# Patient Record
Sex: Male | Born: 1955 | Race: White | Hispanic: No | Marital: Married | Smoking: Never smoker
Health system: Southern US, Community
[De-identification: ages and names within clinical notes are randomized; demographics above are authoritative.]

## PROBLEM LIST (undated history)

## (undated) DIAGNOSIS — B269 Mumps without complication: Secondary | ICD-10-CM

## (undated) DIAGNOSIS — B059 Measles without complication: Secondary | ICD-10-CM

## (undated) DIAGNOSIS — Z8639 Personal history of other endocrine, nutritional and metabolic disease: Secondary | ICD-10-CM

## (undated) DIAGNOSIS — T7840XA Allergy, unspecified, initial encounter: Secondary | ICD-10-CM

## (undated) DIAGNOSIS — B019 Varicella without complication: Secondary | ICD-10-CM

## (undated) DIAGNOSIS — J309 Allergic rhinitis, unspecified: Secondary | ICD-10-CM

## (undated) DIAGNOSIS — J301 Allergic rhinitis due to pollen: Secondary | ICD-10-CM

## (undated) HISTORY — DX: Allergic rhinitis, unspecified: J30.9

## (undated) HISTORY — DX: Measles without complication: B05.9

## (undated) HISTORY — DX: Allergic rhinitis due to pollen: J30.1

## (undated) HISTORY — DX: Mumps without complication: B26.9

## (undated) HISTORY — DX: Varicella without complication: B01.9

## (undated) HISTORY — DX: Personal history of other endocrine, nutritional and metabolic disease: Z86.39

## (undated) HISTORY — DX: Allergy, unspecified, initial encounter: T78.40XA

## (undated) HISTORY — PX: WISDOM TOOTH EXTRACTION: SHX21

---
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
## 2007-02-08 LAB — HM COLONOSCOPY

## 2008-05-08 LAB — HEMOCCULT SLIDES (X 3 CARDS)

## 2009-03-20 LAB — HEMOCCULT SLIDES (X 3 CARDS)

## 2013-02-23 ENCOUNTER — Ambulatory Visit (INDEPENDENT_AMBULATORY_CARE_PROVIDER_SITE_OTHER): Payer: BC Managed Care – PPO | Admitting: Physician Assistant

## 2013-02-23 ENCOUNTER — Encounter: Payer: Self-pay | Admitting: Physician Assistant

## 2013-02-23 VITALS — BP 128/94 | HR 84 | Temp 98.8°F | Resp 16 | Ht 72.5 in | Wt 215.8 lb

## 2013-02-23 DIAGNOSIS — Z0000 Encounter for general adult medical examination without abnormal findings: Secondary | ICD-10-CM | POA: Insufficient documentation

## 2013-02-23 DIAGNOSIS — H6980 Other specified disorders of Eustachian tube, unspecified ear: Secondary | ICD-10-CM | POA: Insufficient documentation

## 2013-02-23 DIAGNOSIS — R399 Unspecified symptoms and signs involving the genitourinary system: Secondary | ICD-10-CM | POA: Insufficient documentation

## 2013-02-23 DIAGNOSIS — R3989 Other symptoms and signs involving the genitourinary system: Secondary | ICD-10-CM

## 2013-02-23 DIAGNOSIS — R5381 Other malaise: Secondary | ICD-10-CM | POA: Insufficient documentation

## 2013-02-23 DIAGNOSIS — J309 Allergic rhinitis, unspecified: Secondary | ICD-10-CM | POA: Insufficient documentation

## 2013-02-23 DIAGNOSIS — R42 Dizziness and giddiness: Secondary | ICD-10-CM | POA: Insufficient documentation

## 2013-02-23 LAB — HEPATIC FUNCTION PANEL
ALT: 36 U/L (ref 0–53)
AST: 29 U/L (ref 0–37)
Albumin: 4.8 g/dL (ref 3.5–5.2)

## 2013-02-23 LAB — LIPID PANEL: Cholesterol: 233 mg/dL — ABNORMAL HIGH (ref 0–200)

## 2013-02-23 LAB — BASIC METABOLIC PANEL
BUN: 10 mg/dL (ref 6–23)
CO2: 24 mEq/L (ref 19–32)
Calcium: 9.5 mg/dL (ref 8.4–10.5)
Chloride: 103 mEq/L (ref 96–112)
Creat: 0.74 mg/dL (ref 0.50–1.35)
Glucose, Bld: 92 mg/dL (ref 70–99)

## 2013-02-23 LAB — CBC WITH DIFFERENTIAL/PLATELET
Basophils Absolute: 0 10*3/uL (ref 0.0–0.1)
Eosinophils Relative: 2 % (ref 0–5)
HCT: 44 % (ref 39.0–52.0)
Lymphocytes Relative: 30 % (ref 12–46)
Lymphs Abs: 1.7 10*3/uL (ref 0.7–4.0)
MCHC: 35 g/dL (ref 30.0–36.0)
MCV: 79.6 fL (ref 78.0–100.0)
Monocytes Absolute: 0.5 10*3/uL (ref 0.1–1.0)
RBC: 5.53 MIL/uL (ref 4.22–5.81)
RDW: 13.5 % (ref 11.5–15.5)
WBC: 5.8 10*3/uL (ref 4.0–10.5)

## 2013-02-23 LAB — HEMOGLOBIN A1C: Mean Plasma Glucose: 111 mg/dL (ref <117)

## 2013-02-23 MED ORDER — FLUTICASONE PROPIONATE 50 MCG/ACT NA SUSP: 2.0000 | Freq: Every day | NASAL | Status: DC

## 2013-02-23 NOTE — Progress Notes (Signed)
Patient ID: Steve Schwartz, male   DOB: 08-16-1955, 57 y.o.   MRN: [id number]  Patient presents to clinic today to establish care.  Acute Concerns: Occasional lightheadedness -- patient sometimes feels lightheaded with quick positional changes.  Denies history of anemia.  Episodes may be months apart.  Urinary symptoms -- occasional urgency, hesitancy, and nocturia x 3 (improved with supplement -- prostac[facility]), no abnormal PSA in the past.  Has had worsening symptoms over the past year.  Last PCP told him it was just a part of aging.  Denies dysuria, hematuria, hx of diabetes or kidney stone.  Fatigue -- patient reports increase in fatigue over the past year.  Tries to saty active and eat a healthy diet.  Denies shortness of breath, chest pain, heart palpitations.  Denies history of anemia.  Patient denies hx of low testosterone.  Denies decrease in libido or change to erectile function.  Denies difficulty with concentration or memory.  Chronic Issues: Eustachian Tube Dysfunction -- diagnosed by ENT.  Patient takes occasional sudafed for relief of symtpoms  Allergic Rhinitis -- patient endorses seasonal rhinitis for which he uses chlorpheniramine.  Health Maintenance: Dental -- up-to-date Vision -- up-to-date Colonoscopy -- last colonoscopy 6 years years ago.  Due in 2018.  Immunizations -- up-to-date.  TdaP 2013.  Declines flu shot.  Past Medical History  Diagnosis Date  . Hay fever   . Chicken pox   . Mumps   . Measles     No current outpatient prescriptions on file prior to visit.   No current facility-administered medications on file prior to visit.   No Known Allergies  Family History  Problem Relation Age of Onset  . Hypertension Mother   . Uterine cancer Mother   . Thyroid disease Mother   . Thyroid disease Sister     x2  . Emphysema Father   . Lung disease Father   . Other Father     Blood Disorder  . Stroke Paternal Grandmother   . Heart disease Paternal  Grandfather   . Heart disease Maternal Grandfather   . Obesity Sister   . Healthy Daughter     x2    History   Social History  . Marital Status: Married    Spouse Name: N/A    Number of Children: N/A  . Years of Education: N/A   Social History Main Topics  . Smoking status: Never Smoker   . Smokeless tobacco: Never Used  . Alcohol Use: Yes     Comment: rare  . Drug Use: No  . Sexual Activity: Yes    Birth Control/ Protection: None   Other Topics Concern  . None   Social History Narrative  . None   Review of Systems  Constitutional: Negative for fever, chills, weight loss and malaise/fatigue.  HENT: Negative for ear discharge, ear pain, hearing loss and tinnitus.   Eyes: Negative for blurred vision, double vision and photophobia.  Respiratory: Negative for cough, shortness of breath and wheezing.   Cardiovascular: Negative for chest pain and palpitations.  Gastrointestinal: Negative for heartburn, nausea, vomiting, abdominal pain, diarrhea, constipation, blood in stool and melena.  Genitourinary: Positive for urgency and frequency. Negative for dysuria, hematuria and flank pain.       Nocturia and urinary hesitancy  Neurological: Negative for dizziness, seizures, loss of consciousness and headaches.  Endo/Heme/Allergies: Positive for environmental allergies.  Psychiatric/Behavioral: Negative for depression, suicidal ideas, hallucinations and substance abuse. The patient is not nervous/anxious.    Filed  Vitals:   02/23/13 0951  BP: 128/94  Pulse: 84  Temp: 98.8 F (37.1 C)  Resp: 16    Physical Exam  Vitals reviewed. Constitutional: He is oriented to person, place, and time and well-developed, well-nourished, and in no distress.  HENT:  Head: Normocephalic and atraumatic.  Right Ear: External ear normal.  Left Ear: External ear normal.  Nose: Nose normal.  Mouth/Throat: Oropharynx is clear and moist. No oropharyngeal exudate.  TM within normal limits  bilaterally.  Eyes: Conjunctivae and EOM are normal. Pupils are equal, round, and reactive to light.  Neck: Normal range of motion. Neck supple. No thyromegaly present.  Cardiovascular: Normal rate, regular rhythm, normal heart sounds and intact distal pulses.   Pulmonary/Chest: Effort normal and breath sounds normal. No respiratory distress. He has no wheezes. He has no rales. He exhibits no tenderness.  Abdominal: Soft. Bowel sounds are normal. He exhibits no distension and no mass. There is no tenderness. There is no rebound and no guarding.  Genitourinary: Rectum normal, testes/scrotum normal and penis normal. Prostate is not enlarged and not tender. No discharge found.  Musculoskeletal: Normal range of motion.  Lymphadenopathy:    He has no cervical adenopathy.  Neurological: He is alert and oriented to person, place, and time. No cranial nerve deficit. Gait normal.  Skin: Skin is warm and dry. No rash noted.  Psychiatric: Affect normal.      Assessment/Plan: Allergic rhinitis Rx Flonase.  Eustachian tube dysfunction Rx Flonase to help with symptoms.  Lower urinary tract symptoms (LUTS) Exam WNL.  Will obtain UA and PSA.  Discussed options for symptom relief, pending normal results.  Patient currently on ProstaCare OTC supplement and satisfied with results.  Will think about starting Flomax.  Other malaise and fatigue Will obtain CBC w diff, BMP, TSH and Testosterone levels.  Encourage increase in exercise and other physical activities.  Episodic lightheadedness Will check CBC for anemia.  BP good in clinic.  Symptoms very infrequent and seem mostly related to positional change.  Encourage good fluid intake.  Visit for preventive health examination Will obtain fasting labs.  Patient up-to-date on Health Maintenance.  Declines flu shot today.

## 2013-02-23 NOTE — Assessment & Plan Note (Signed)
Will obtain fasting labs.  Patient up-to-date on Health Maintenance.  Declines flu shot today.

## 2013-02-23 NOTE — Assessment & Plan Note (Signed)
Will check CBC for anemia.  BP good in clinic.  Symptoms very infrequent and seem mostly related to positional change.  Encourage good fluid intake.

## 2013-02-23 NOTE — Assessment & Plan Note (Signed)
Rx: Flonase

## 2013-02-23 NOTE — Assessment & Plan Note (Signed)
Exam WNL.  Will obtain UA and PSA.  Discussed options for symptom relief, pending normal results.  Patient currently on ProstaCare OTC supplement and satisfied with results.  Will think about starting Flomax.

## 2013-02-23 NOTE — Assessment & Plan Note (Signed)
Will obtain CBC w diff, BMP, TSH and Testosterone levels.  Encourage increase in exercise and other physical activities.

## 2013-02-23 NOTE — Patient Instructions (Signed)
Please obtain labs.  I will call you with your results.  Use Flonase daily.  Continue good diet and exercise.  We will schedule follow-up based on lab results.

## 2013-02-23 NOTE — Assessment & Plan Note (Signed)
Rx Flonase to help with symptoms.

## 2013-02-24 LAB — URINALYSIS, ROUTINE W REFLEX MICROSCOPIC
Glucose, UA: NEGATIVE mg/dL
Hgb urine dipstick: NEGATIVE
Ketones, ur: NEGATIVE mg/dL
Leukocytes, UA: NEGATIVE
Specific Gravity, Urine: 1.01 (ref 1.005–1.030)
pH: 7.5 (ref 5.0–8.0)

## 2013-02-24 LAB — TESTOSTERONE: Testosterone: 466 ng/dL (ref 300–890)

## 2013-02-28 ENCOUNTER — Encounter: Payer: Self-pay | Admitting: *Deleted

## 2013-03-10 ENCOUNTER — Telehealth: Payer: Self-pay | Admitting: Physician Assistant

## 2013-03-10 NOTE — Telephone Encounter (Signed)
Received medical records from [facility] at [facility]: [phone] F: [phone]

## 2013-09-09 ENCOUNTER — Encounter: Payer: Self-pay | Admitting: Physician Assistant

## 2014-02-15 ENCOUNTER — Encounter: Payer: Self-pay | Admitting: Physician Assistant

## 2014-02-15 ENCOUNTER — Ambulatory Visit (INDEPENDENT_AMBULATORY_CARE_PROVIDER_SITE_OTHER): Payer: BC Managed Care – PPO | Admitting: Physician Assistant

## 2014-02-15 VITALS — BP 141/95 | HR 82 | Temp 99.4°F | Resp 16 | Ht 72.5 in | Wt 215.2 lb

## 2014-02-15 DIAGNOSIS — N400 Enlarged prostate without lower urinary tract symptoms: Secondary | ICD-10-CM

## 2014-02-15 DIAGNOSIS — G8929 Other chronic pain: Secondary | ICD-10-CM

## 2014-02-15 DIAGNOSIS — Z0000 Encounter for general adult medical examination without abnormal findings: Secondary | ICD-10-CM

## 2014-02-15 DIAGNOSIS — M25569 Pain in unspecified knee: Secondary | ICD-10-CM

## 2014-02-15 DIAGNOSIS — M25562 Pain in left knee: Secondary | ICD-10-CM

## 2014-02-15 DIAGNOSIS — Z136 Encounter for screening for cardiovascular disorders: Secondary | ICD-10-CM

## 2014-02-15 DIAGNOSIS — L821 Other seborrheic keratosis: Secondary | ICD-10-CM

## 2014-02-15 DIAGNOSIS — Z125 Encounter for screening for malignant neoplasm of prostate: Secondary | ICD-10-CM | POA: Insufficient documentation

## 2014-02-15 LAB — LIPID PANEL W/DIRECT LDL/HDL RATIO
CHOL/HDL RATIO: 7.2 ratio
CHOLESTEROL - DIR LDL/HDL RATIO: 216 mg/dL — AB (ref 0–200)
HDL: 30 mg/dL — AB (ref >39)
LDL DIRECT: 174 mg/dL — AB
LDL/HDL RATIO (DIRECT LDL): 5.8 ratio
TRIGLYCERIDES: 100 mg/dL (ref <150)

## 2014-02-15 LAB — BASIC METABOLIC PANEL WITH GFR
BUN: 11 mg/dL (ref 6–23)
CALCIUM: 8.9 mg/dL (ref 8.4–10.5)
CO2: 24 mEq/L (ref 19–32)
Chloride: 105 mEq/L (ref 96–112)
Creat: 0.72 mg/dL (ref 0.50–1.35)
GFR, Est Non African American: >89 mL/min
GLUCOSE: 88 mg/dL (ref 70–99)
POTASSIUM: 3.9 meq/L (ref 3.5–5.3)
SODIUM: 136 meq/L (ref 135–145)

## 2014-02-15 LAB — CBC
HCT: 43.5 % (ref 39.0–52.0)
Hemoglobin: 14.5 g/dL (ref 13.0–17.0)
MCHC: 33.3 g/dL (ref 30.0–36.0)
MCV: 83.1 fl (ref 78.0–100.0)
PLATELETS: 243 10*3/uL (ref 150.0–400.0)
RBC: 5.24 Mil/uL (ref 4.22–5.81)
RDW: 12.8 % (ref 11.5–15.5)
WBC: 5.5 10*3/uL (ref 4.0–10.5)

## 2014-02-15 LAB — URINALYSIS, ROUTINE W REFLEX MICROSCOPIC
BILIRUBIN URINE: NEGATIVE
HGB URINE DIPSTICK: NEGATIVE
KETONES UR: NEGATIVE
Leukocytes, UA: NEGATIVE
Nitrite: NEGATIVE
RBC / HPF: NONE SEEN (ref 0–?)
Specific Gravity, Urine: 1.015 (ref 1.000–1.030)
TOTAL PROTEIN, URINE-UPE24: NEGATIVE
URINE GLUCOSE: NEGATIVE
UROBILINOGEN UA: 0.2 (ref 0.0–1.0)
WBC, UA: NONE SEEN (ref 0–?)
pH: 7.5 (ref 5.0–8.0)

## 2014-02-15 LAB — TSH: TSH: 3.47 u[IU]/mL (ref 0.35–4.50)

## 2014-02-15 LAB — PSA: PSA: 0.77 ng/mL (ref 0.10–4.00)

## 2014-02-15 LAB — HEMOGLOBIN A1C: Hgb A1c MFr Bld: 5.5 % (ref 4.6–6.5)

## 2014-02-15 NOTE — Patient Instructions (Addendum)
Please obtain labs. I will call you with your results.  Start the [employer] supplement following bottle instructions.  If PSA is elevated, we will be sending you to a Urologist.   Preventive Care for Adults A healthy lifestyle and preventive care can promote health and wellness. Preventive health guidelines for men include the following key practices:  A routine yearly physical is a good way to check with your health care provider about your health and preventative screening. It is a chance to share any concerns and updates on your health and to receive a thorough exam.  Visit your dentist for a routine exam and preventative care every 6 months. Brush your teeth twice a day and floss once a day. Good oral hygiene prevents tooth decay and gum disease.  The frequency of eye exams is based on your age, health, family medical history, use of contact lenses, and other factors. Follow your health care provider's recommendations for frequency of eye exams.  Eat a healthy diet. Foods such as vegetables, fruits, whole grains, low-fat dairy products, and lean protein foods contain the nutrients you need without too many calories. Decrease your intake of foods high in solid fats, added sugars, and salt. Eat the right amount of calories for you.Get information about a proper diet from your health care provider, if necessary.  Regular physical exercise is one of the most important things you can do for your health. Most adults should get at least 150 minutes of moderate-intensity exercise (any activity that increases your heart rate and causes you to sweat) each week. In addition, most adults need muscle-strengthening exercises on 2 or more days a week.  Maintain a healthy weight. The body mass index (BMI) is a screening tool to identify possible weight problems. It provides an estimate of body fat based on height and weight. Your health care provider can find your BMI and can help you achieve or maintain a  healthy weight.For adults 20 years and older:  A BMI below 18.5 is considered underweight.  A BMI of 18.5 to 24.9 is normal.  A BMI of 25 to 29.9 is considered overweight.  A BMI of 30 and above is considered obese.  Maintain normal blood lipids and cholesterol levels by exercising and minimizing your intake of saturated fat. Eat a balanced diet with plenty of fruit and vegetables. Blood tests for lipids and cholesterol should begin at age 76 and be repeated every 5 years. If your lipid or cholesterol levels are high, you are over 50, or you are at high risk for heart disease, you may need your cholesterol levels checked more frequently.Ongoing high lipid and cholesterol levels should be treated with medicines if diet and exercise are not working.  If you smoke, find out from your health care provider how to quit. If you do not use tobacco, do not start.  Lung cancer screening is recommended for adults aged 57-80 years who are at high risk for developing lung cancer because of a history of smoking. A yearly low-dose CT scan of the lungs is recommended for people who have at least a 30-pack-year history of smoking and are a current smoker or have quit within the past 15 years. A pack year of smoking is smoking an average of 1 pack of cigarettes a day for 1 year (for example: 1 pack a day for 30 years or 2 packs a day for 15 years). Yearly screening should continue until the smoker has stopped smoking for at least 15  years. Yearly screening should be stopped for people who develop a health problem that would prevent them from having lung cancer treatment.  If you choose to drink alcohol, do not have more than 2 drinks per day. One drink is considered to be 12 ounces (355 mL) of beer, 5 ounces (148 mL) of wine, or 1.5 ounces (44 mL) of liquor.  Avoid use of street drugs. Do not share needles with anyone. Ask for help if you need support or instructions about stopping the use of drugs.  High blood  pressure causes heart disease and increases the risk of stroke. Your blood pressure should be checked at least every 1-2 years. Ongoing high blood pressure should be treated with medicines, if weight loss and exercise are not effective.  If you are 84-59 years old, ask your health care provider if you should take aspirin to prevent heart disease.  Diabetes screening involves taking a blood sample to check your fasting blood sugar level. This should be done once every 3 years, after age 82, if you are within normal weight and without risk factors for diabetes. Testing should be considered at a younger age or be carried out more frequently if you are overweight and have at least 1 risk factor for diabetes.  Colorectal cancer can be detected and often prevented. Most routine colorectal cancer screening begins at the age of 12 and continues through age 39. However, your health care provider may recommend screening at an earlier age if you have risk factors for colon cancer. On a yearly basis, your health care provider may provide home test kits to check for hidden blood in the stool. Use of a small camera at the end of a tube to directly examine the colon (sigmoidoscopy or colonoscopy) can detect the earliest forms of colorectal cancer. Talk to your health care provider about this at age 75, when routine screening begins. Direct exam of the colon should be repeated every 5-10 years through age 35, unless early forms of precancerous polyps or small growths are found.  People who are at an increased risk for hepatitis B should be screened for this virus. You are considered at high risk for hepatitis B if:  You were born in a country where hepatitis B occurs often. Talk with your health care provider about which countries are considered high risk.  Your parents were born in a high-risk country and you have not received a shot to protect against hepatitis B (hepatitis B vaccine).  You have HIV or AIDS.  You  use needles to inject street drugs.  You live with, or have sex with, someone who has hepatitis B.  You are a man who has sex with other men (MSM).  You get hemodialysis treatment.  You take certain medicines for conditions such as cancer, organ transplantation, and autoimmune conditions.  Hepatitis C blood testing is recommended for all people born from [birth year] through 1965 and any individual with known risks for hepatitis C.  Practice safe sex. Use condoms and avoid high-risk sexual practices to reduce the spread of sexually transmitted infections (STIs). STIs include gonorrhea, chlamydia, syphilis, trichomonas, herpes, HPV, and human immunodeficiency virus (HIV). Herpes, HIV, and HPV are viral illnesses that have no cure. They can result in disability, cancer, and death.  If you are at risk of being infected with HIV, it is recommended that you take a prescription medicine daily to prevent HIV infection. This is called preexposure prophylaxis (PrEP). You are considered at risk if:  You are a man who has sex with other men (MSM) and have other risk factors.  You are a heterosexual man, are sexually active, and are at increased risk for HIV infection.  You take drugs by injection.  You are sexually active with a partner who has HIV.  Talk with your health care provider about whether you are at high risk of being infected with HIV. If you choose to begin PrEP, you should first be tested for HIV. You should then be tested every 3 months for as long as you are taking PrEP.  A one-time screening for abdominal aortic aneurysm (AAA) and surgical repair of large AAAs by ultrasound are recommended for men ages 57 to 59 years who are current or former smokers.  Healthy men should no longer receive prostate-specific antigen (PSA) blood tests as part of routine cancer screening. Talk with your health care provider about prostate cancer screening.  Testicular cancer screening is not recommended for  adult males who have no symptoms. Screening includes self-exam, a health care provider exam, and other screening tests. Consult with your health care provider about any symptoms you have or any concerns you have about testicular cancer.  Use sunscreen. Apply sunscreen liberally and repeatedly throughout the day. You should seek shade when your shadow is shorter than you. Protect yourself by wearing long sleeves, pants, a wide-brimmed hat, and sunglasses year round, whenever you are outdoors.  Once a month, do a whole-body skin exam, using a mirror to look at the skin on your back. Tell your health care provider about new moles, moles that have irregular borders, moles that are larger than a pencil eraser, or moles that have changed in shape or color.  Stay current with required vaccines (immunizations).  Influenza vaccine. All adults should be immunized every year.  Tetanus, diphtheria, and acellular pertussis (Td, Tdap) vaccine. An adult who has not previously received Tdap or who does not know his vaccine status should receive 1 dose of Tdap. This initial dose should be followed by tetanus and diphtheria toxoids (Td) booster doses every 10 years. Adults with an unknown or incomplete history of completing a 3-dose immunization series with Td-containing vaccines should begin or complete a primary immunization series including a Tdap dose. Adults should receive a Td booster every 10 years.  Varicella vaccine. An adult without evidence of immunity to varicella should receive 2 doses or a second dose if he has previously received 1 dose.  Human papillomavirus (HPV) vaccine. Males aged 22-21 years who have not received the vaccine previously should receive the 3-dose series. Males aged 22-26 years may be immunized. Immunization is recommended through the age of 20 years for any male who has sex with males and did not get any or all doses earlier. Immunization is recommended for any person with an  immunocompromised condition through the age of 9 years if he did not get any or all doses earlier. During the 3-dose series, the second dose should be obtained 4-8 weeks after the first dose. The third dose should be obtained 24 weeks after the first dose and 16 weeks after the second dose.  Zoster vaccine. One dose is recommended for adults aged 32 years or older unless certain conditions are present.  Measles, mumps, and rubella (MMR) vaccine. Adults born before [birth year] generally are considered immune to measles and mumps. Adults born in [birth year] or later should have 1 or more doses of MMR vaccine unless there is a contraindication to the vaccine or  there is laboratory evidence of immunity to each of the three diseases. A routine second dose of MMR vaccine should be obtained at least 28 days after the first dose for students attending postsecondary schools, health care workers, or international travelers. People who received inactivated measles vaccine or an unknown type of measles vaccine during 1963-1967 should receive 2 doses of MMR vaccine. People who received inactivated mumps vaccine or an unknown type of mumps vaccine before 1979 and are at high risk for mumps infection should consider immunization with 2 doses of MMR vaccine. Unvaccinated health care workers born before [birth year] who lack laboratory evidence of measles, mumps, or rubella immunity or laboratory confirmation of disease should consider measles and mumps immunization with 2 doses of MMR vaccine or rubella immunization with 1 dose of MMR vaccine.  Pneumococcal 13-valent conjugate (PCV13) vaccine. When indicated, a person who is uncertain of his immunization history and has no record of immunization should receive the PCV13 vaccine. An adult aged 43 years or older who has certain medical conditions and has not been previously immunized should receive 1 dose of PCV13 vaccine. This PCV13 should be followed with a dose of pneumococcal polysaccharide  (PPSV23) vaccine. The PPSV23 vaccine dose should be obtained at least 8 weeks after the dose of PCV13 vaccine. An adult aged 66 years or older who has certain medical conditions and previously received 1 or more doses of PPSV23 vaccine should receive 1 dose of PCV13. The PCV13 vaccine dose should be obtained 1 or more years after the last PPSV23 vaccine dose.  Pneumococcal polysaccharide (PPSV23) vaccine. When PCV13 is also indicated, PCV13 should be obtained first. All adults aged 3 years and older should be immunized. An adult younger than age 61 years who has certain medical conditions should be immunized. Any person who resides in a nursing home or long-term care facility should be immunized. An adult smoker should be immunized. People with an immunocompromised condition and certain other conditions should receive both PCV13 and PPSV23 vaccines. People with human immunodeficiency virus (HIV) infection should be immunized as soon as possible after diagnosis. Immunization during chemotherapy or radiation therapy should be avoided. Routine use of PPSV23 vaccine is not recommended for American Indians, [place] Natives, or people younger than 65 years unless there are medical conditions that require PPSV23 vaccine. When indicated, people who have unknown immunization and have no record of immunization should receive PPSV23 vaccine. One-time revaccination 5 years after the first dose of PPSV23 is recommended for people aged 19-64 years who have chronic kidney failure, nephrotic syndrome, asplenia, or immunocompromised conditions. People who received 1-2 doses of PPSV23 before age 87 years should receive another dose of PPSV23 vaccine at age 63 years or later if at least 5 years have passed since the previous dose. Doses of PPSV23 are not needed for people immunized with PPSV23 at or after age 30 years.  Meningococcal vaccine. Adults with asplenia or persistent complement component deficiencies should receive 2  doses of quadrivalent meningococcal conjugate (MenACWY-D) vaccine. The doses should be obtained at least 2 months apart. Microbiologists working with certain meningococcal bacteria, [employer] recruits, people at risk during an outbreak, and people who travel to or live in countries with a high rate of meningitis should be immunized. A first-year college student up through age 36 years who is living in a residence hall should receive a dose if he did not receive a dose on or after his 16th birthday. Adults who have certain high-risk conditions should receive one  or more doses of vaccine.  Hepatitis A vaccine. Adults who wish to be protected from this disease, have certain high-risk conditions, work with hepatitis A-infected animals, work in hepatitis A research labs, or travel to or work in countries with a high rate of hepatitis A should be immunized. Adults who were previously unvaccinated and who anticipate close contact with an international adoptee during the first 60 days after arrival in the [place] from a country with a high rate of hepatitis A should be immunized.  Hepatitis B vaccine. Adults should be immunized if they wish to be protected from this disease, have certain high-risk conditions, may be exposed to blood or other infectious body fluids, are household contacts or sex partners of hepatitis B positive people, are clients or workers in certain care facilities, or travel to or work in countries with a high rate of hepatitis B.  Haemophilus influenzae type b (Hib) vaccine. A previously unvaccinated person with asplenia or sickle cell disease or having a scheduled splenectomy should receive 1 dose of Hib vaccine. Regardless of previous immunization, a recipient of a hematopoietic stem cell transplant should receive a 3-dose series 6-12 months after his successful transplant. Hib vaccine is not recommended for adults with HIV infection. Preventive Service / Frequency Ages 26 to 59  Blood  pressure check.** / Every 1 to 2 years.  Lipid and cholesterol check.** / Every 5 years beginning at age 91.  Hepatitis C blood test.** / For any individual with known risks for hepatitis C.  Skin self-exam. / Monthly.  Influenza vaccine. / Every year.  Tetanus, diphtheria, and acellular pertussis (Tdap, Td) vaccine.** / Consult your health care provider. 1 dose of Td every 10 years.  Varicella vaccine.** / Consult your health care provider.  HPV vaccine. / 3 doses over 6 months, if 32 or younger.  Measles, mumps, rubella (MMR) vaccine.** / You need at least 1 dose of MMR if you were born in 1957 or later. You may also need a second dose.  Pneumococcal 13-valent conjugate (PCV13) vaccine.** / Consult your health care provider.  Pneumococcal polysaccharide (PPSV23) vaccine.** / 1 to 2 doses if you smoke cigarettes or if you have certain conditions.  Meningococcal vaccine.** / 1 dose if you are age 52 to 17 years and a Market researcher living in a residence hall, or have one of several medical conditions. You may also need additional booster doses.  Hepatitis A vaccine.** / Consult your health care provider.  Hepatitis B vaccine.** / Consult your health care provider.  Haemophilus influenzae type b (Hib) vaccine.** / Consult your health care provider. Ages 56 to 29  Blood pressure check.** / Every 1 to 2 years.  Lipid and cholesterol check.** / Every 5 years beginning at age 27.  Lung cancer screening. / Every year if you are aged 77-80 years and have a 30-pack-year history of smoking and currently smoke or have quit within the past 15 years. Yearly screening is stopped once you have quit smoking for at least 15 years or develop a health problem that would prevent you from having lung cancer treatment.  Fecal occult blood test (FOBT) of stool. / Every year beginning at age 37 and continuing until age 87. You may not have to do this test if you get a colonoscopy every 10  years.  Flexible sigmoidoscopy** or colonoscopy.** / Every 5 years for a flexible sigmoidoscopy or every 10 years for a colonoscopy beginning at age 60 and continuing until age  75.  Hepatitis C blood test.** / For all people born from [birth year] through 1965 and any individual with known risks for hepatitis C.  Skin self-exam. / Monthly.  Influenza vaccine. / Every year.  Tetanus, diphtheria, and acellular pertussis (Tdap/Td) vaccine.** / Consult your health care provider. 1 dose of Td every 10 years.  Varicella vaccine.** / Consult your health care provider.  Zoster vaccine.** / 1 dose for adults aged 76 years or older.  Measles, mumps, rubella (MMR) vaccine.** / You need at least 1 dose of MMR if you were born in 1958 or later. You may also need a second dose.  Pneumococcal 13-valent conjugate (PCV13) vaccine.** / Consult your health care provider.  Pneumococcal polysaccharide (PPSV23) vaccine.** / 1 to 2 doses if you smoke cigarettes or if you have certain conditions.  Meningococcal vaccine.** / Consult your health care provider.  Hepatitis A vaccine.** / Consult your health care provider.  Hepatitis B vaccine.** / Consult your health care provider.  Haemophilus influenzae type b (Hib) vaccine.** / Consult your health care provider. Ages 74 and over  Blood pressure check.** / Every 1 to 2 years.  Lipid and cholesterol check.**/ Every 5 years beginning at age 55.  Lung cancer screening. / Every year if you are aged 60-80 years and have a 30-pack-year history of smoking and currently smoke or have quit within the past 15 years. Yearly screening is stopped once you have quit smoking for at least 15 years or develop a health problem that would prevent you from having lung cancer treatment.  Fecal occult blood test (FOBT) of stool. / Every year beginning at age 21 and continuing until age 36. You may not have to do this test if you get a colonoscopy every 10 years.  Flexible  sigmoidoscopy** or colonoscopy.** / Every 5 years for a flexible sigmoidoscopy or every 10 years for a colonoscopy beginning at age 70 and continuing until age 5.  Hepatitis C blood test.** / For all people born from [birth year] through 1966 and any individual with known risks for hepatitis C.  Abdominal aortic aneurysm (AAA) screening.** / A one-time screening for ages 89 to 27 years who are current or former smokers.  Skin self-exam. / Monthly.  Influenza vaccine. / Every year.  Tetanus, diphtheria, and acellular pertussis (Tdap/Td) vaccine.** / 1 dose of Td every 10 years.  Varicella vaccine.** / Consult your health care provider.  Zoster vaccine.** / 1 dose for adults aged 58 years or older.  Pneumococcal 13-valent conjugate (PCV13) vaccine.** / Consult your health care provider.  Pneumococcal polysaccharide (PPSV23) vaccine.** / 1 dose for all adults aged 43 years and older.  Meningococcal vaccine.** / Consult your health care provider.  Hepatitis A vaccine.** / Consult your health care provider.  Hepatitis B vaccine.** / Consult your health care provider.  Haemophilus influenzae type b (Hib) vaccine.** / Consult your health care provider. **Family history and personal history of risk and conditions may change your health care provider's recommendations. Document Released: 06/10/2001 Document Revised: 04/19/2013 Document Reviewed: 09/09/2010 [facility] Patient Information 2015 [place], [place]. This information is not intended to replace advice given to you by your health care provider. Make sure you discuss any questions you have with your health care provider.

## 2014-02-15 NOTE — Assessment & Plan Note (Signed)
Discussed treatment options.  PSA in process. Patient wishes to continue supplements for now.  Encouraged use of [place].  If PSA elevated, will be referred to Urology.

## 2014-02-15 NOTE — Progress Notes (Signed)
Pre visit review using our clinic review tool, if applicable. No additional management support is needed unless otherwise documented below in the visit note/SLS  

## 2014-02-15 NOTE — Assessment & Plan Note (Signed)
EKG reveals NSR. Encouraged 81 mg ASA daily. Will check fasting lipid panel today.

## 2014-02-15 NOTE — Progress Notes (Signed)
Patient presents to clinic today for annual exam.  Patient is fasting for labs.  Patient complains of chronic left knee pain with possible twisting injury in the past.  Pain is only occurring occasionally with walking up stairs.  Endorses occasional buckling.  Denies recent injury. Denies swelling of decreased ROM.  Patient also wishes to have a skin lesion of left forearm looked at.  Has been evaluated by Dermatology previously and states that they did not note anything worrisome.  States lesion is slightly larger at present.  Is non-pruritic, non-painful.  Endorses is skin toned.  Denies excess sun exposure.  Endorses continued LUTS.  Endorses urinary hesitancy. Denies post-void dribbling.  Is on prostate supplement with good relief of symptoms. PSA last year within normal limits. Patient has previously refused Rx medication for BPH.  Health Maintenance: Dental -- up-to-date Vision -- up-to-date Immunizations -- Declines flu shot.  Required immunizations are up-to-date. Colonoscopy -- up-to-date.  Due in 2018  Past Medical History  Diagnosis Date  . Hay fever   . Chicken pox   . Mumps   . Measles   . AR (allergic rhinitis)   . H/O vitamin D deficiency     Past Surgical History  Procedure Laterality Date  . Tonsillectomy and adenoidectomy  1959  . Wisdom tooth extraction      Current Outpatient Prescriptions on File Prior to Visit  Medication Sig Dispense Refill  . chlorpheniramine (CHLOR-TRIMETON) 4 MG tablet Take 4 mg by mouth as needed for allergies.      Marland Kitchen. ibuprofen (ADVIL,MOTRIN) 200 MG tablet Take 200 mg by mouth as needed for pain.      . NON FORMULARY Take by mouth as directed. [employer] Prosta[employer] Supplement.      . pseudoephedrine (SUDAFED) 120 MG 12 hr tablet Take 120 mg by mouth as needed for congestion.       No current facility-administered medications on file prior to visit.    No Known Allergies  Family History  Problem Relation Age of Onset  .  Hypertension Mother   . Uterine cancer Mother   . Thyroid disease Mother   . Thyroid disease Sister     x2  . Emphysema Father   . Lung disease Father   . Other Father     Blood Disorder  . Stroke Paternal Grandmother   . Heart disease Paternal Grandfather   . Heart disease Maternal Grandfather   . Obesity Sister   . Healthy Daughter     x2  . Diabetes Other   . Myelodysplastic syndrome Father     History   Social History  . Marital Status: Married    Spouse Name: N/A    Number of Children: N/A  . Years of Education: N/A   Occupational History  . Not on file.   Social History Main Topics  . Smoking status: Never Smoker   . Smokeless tobacco: Never Used  . Alcohol Use: Yes     Comment: rare  . Drug Use: No  . Sexual Activity: Yes    Birth Control/ Protection: None   Other Topics Concern  . Not on file   Social History Narrative  . No narrative on file   Review of Systems  Constitutional: Negative for fever and weight loss.  HENT: Negative for ear discharge, ear pain, hearing loss and tinnitus.   Eyes: Negative for blurred vision, double vision, photophobia and pain.  Respiratory: Negative for cough and shortness of breath.  Cardiovascular: Negative for chest pain and palpitations.  Gastrointestinal: Negative for heartburn, nausea, vomiting, abdominal pain, diarrhea, constipation, blood in stool and melena.  Genitourinary: Negative for dysuria, urgency, frequency, hematuria and flank pain.  Neurological: Negative for dizziness, tingling, tremors, sensory change, speech change, loss of consciousness and headaches.  Endo/Heme/Allergies: Negative for environmental allergies.  Psychiatric/Behavioral: Negative for depression, suicidal ideas, hallucinations and substance abuse. The patient is not nervous/anxious and does not have insomnia.    BP 141/95  Pulse 82  Temp(Src) 99.4 F (37.4 C) (Oral)  Resp 16  Ht 6' 0.5" (1.842 m)  Wt 215 lb 4 oz (97.637 kg)  BMI  28.78 kg/m2  SpO2 97%  Physical Exam  Vitals reviewed. Constitutional: He is oriented to person, place, and time and well-developed, well-nourished, and in no distress.  HENT:  Head: Normocephalic and atraumatic.  Right Ear: External ear normal.  Left Ear: External ear normal.  Nose: Nose normal.  Mouth/Throat: Oropharynx is clear and moist. No oropharyngeal exudate.  TM within normal limits bilaterally.  Eyes: Conjunctivae and EOM are normal. Pupils are equal, round, and reactive to light.  Neck: Neck supple. No thyromegaly present.  Cardiovascular: Normal rate, regular rhythm, normal heart sounds and intact distal pulses.   Pulmonary/Chest: Effort normal and breath sounds normal. No respiratory distress. He has no wheezes. He has no rales. He exhibits no tenderness.  Abdominal: Soft. Bowel sounds are normal. He exhibits no distension and no mass. There is no tenderness. There is no rebound and no guarding.  Lymphadenopathy:    He has no cervical adenopathy.  Neurological: He is alert and oriented to person, place, and time.  Skin: Skin is warm and dry. No rash noted.  Psychiatric: Affect normal.   Assessment/Plan: Visit for preventive health examination Medical history reviewed and updated. Declines flu shot.  Other health maintenance measures up-to-date.  Will obtain fasting labs at today's visit to include PSA.  Prostate cancer screening Patient with normal previous PSA. Some LUTS. DRE reveals symmetric enlargement of prostate without palpable nodule or mass. Will obtain PSA today.   BPH (benign prostatic hyperplasia) Discussed treatment options.  PSA in process. Patient wishes to continue supplements for now.  Encouraged use of Saw [place]metto.  If PSA elevated, will be referred to Urology.  Screening for ischemic heart disease EKG reveals NSR. Encouraged 81 mg ASA daily. Will check fasting lipid panel today.  Chronic knee pain Exam unremarkable. Giving history and symptoms,  concern for possible meniscal tear.  Definite OA component.  Encouraged bracing.  Will obtain x-ray today.  Seborrheic keratosis Discussed benign etiology of lesion.  Only needing removal for cosmetic reasons. Discussed cryotherapy. Patient will give some thought.

## 2014-02-15 NOTE — Assessment & Plan Note (Addendum)
Patient with normal previous PSA. Some LUTS. DRE reveals symmetric enlargement of prostate without palpable nodule or mass. Will obtain PSA today.

## 2014-02-15 NOTE — Assessment & Plan Note (Signed)
Exam unremarkable. Giving history and symptoms, concern for possible meniscal tear.  Definite OA component.  Encouraged bracing.  Will obtain x-ray today.

## 2014-02-15 NOTE — Assessment & Plan Note (Signed)
Discussed benign etiology of lesion.  Only needing removal for cosmetic reasons. Discussed cryotherapy. Patient will give some thought.

## 2014-02-15 NOTE — Assessment & Plan Note (Signed)
Medical history reviewed and updated. Declines flu shot.  Other health maintenance measures up-to-date.  Will obtain fasting labs at today's visit to include PSA.

## 2014-02-16 LAB — HEPATIC FUNCTION PANEL
ALK PHOS: 75 U/L (ref 39–117)
ALT: 31 U/L (ref 0–53)
AST: 33 U/L (ref 0–37)
Albumin: 3.9 g/dL (ref 3.5–5.2)
BILIRUBIN DIRECT: 0.1 mg/dL (ref 0.0–0.3)
BILIRUBIN TOTAL: 0.8 mg/dL (ref 0.2–1.2)
Total Protein: 7.2 g/dL (ref 6.0–8.3)

## 2014-02-17 ENCOUNTER — Telehealth: Payer: Self-pay | Admitting: Physician Assistant

## 2014-02-17 NOTE — Telephone Encounter (Signed)
Caller name: Casimiro NeedleMichael \ Call back number:[phone]   Reason for call:  Would like his detailed lab results mailed to him.

## 2014-02-20 ENCOUNTER — Encounter: Payer: Self-pay | Admitting: *Deleted

## 2014-02-20 NOTE — Telephone Encounter (Signed)
Copy Mailed/SLS

## 2014-03-28 ENCOUNTER — Telehealth: Payer: Self-pay | Admitting: Physician Assistant

## 2014-03-28 NOTE — Telephone Encounter (Signed)
Caller name:Meiners, Jerri Relation to ZO:XWRUpt:self Call back number:[phone] Pharmacy:  Reason for call: pt states he received a copy of his lab results in the mail and did not receive the last page, states he would like to verify if results where on that page and if so to please mail a copy to him. States it as 6 of 6 but he only got 5 of 6.  Labs were done on 02/15/14

## 2014-03-29 NOTE — Telephone Encounter (Signed)
Please inform patient last page is only the Orders placed by provider for lab work/SLS

## 2014-03-31 NOTE — Telephone Encounter (Signed)
Called pt and made him aware of the info.

## 2014-07-28 HISTORY — PX: ROOT CANAL: SHX2363

## 2014-08-30 ENCOUNTER — Encounter: Payer: Self-pay | Admitting: Physician Assistant

## 2014-08-30 ENCOUNTER — Ambulatory Visit (HOSPITAL_BASED_OUTPATIENT_CLINIC_OR_DEPARTMENT_OTHER)
Admission: RE | Admit: 2014-08-30 | Discharge: 2014-08-30 | Disposition: A | Discharge status: Home / Self Care | Payer: BLUE CROSS/BLUE SHIELD | Source: Ambulatory Visit | Attending: Physician Assistant | Admitting: Physician Assistant

## 2014-08-30 ENCOUNTER — Telehealth: Payer: Self-pay | Admitting: Physician Assistant

## 2014-08-30 ENCOUNTER — Other Ambulatory Visit: Payer: Self-pay | Admitting: Physician Assistant

## 2014-08-30 ENCOUNTER — Ambulatory Visit (INDEPENDENT_AMBULATORY_CARE_PROVIDER_SITE_OTHER): Payer: BLUE CROSS/BLUE SHIELD | Admitting: Physician Assistant

## 2014-08-30 VITALS — BP 153/101 | HR 91 | Temp 98.0°F | Wt 220.0 lb

## 2014-08-30 DIAGNOSIS — M25562 Pain in left knee: Secondary | ICD-10-CM

## 2014-08-30 DIAGNOSIS — M5134 Other intervertebral disc degeneration, thoracic region: Secondary | ICD-10-CM | POA: Insufficient documentation

## 2014-08-30 DIAGNOSIS — G8929 Other chronic pain: Secondary | ICD-10-CM

## 2014-08-30 DIAGNOSIS — M549 Dorsalgia, unspecified: Secondary | ICD-10-CM

## 2014-08-30 DIAGNOSIS — M546 Pain in thoracic spine: Secondary | ICD-10-CM | POA: Diagnosis not present

## 2014-08-30 DIAGNOSIS — M519 Unspecified thoracic, thoracolumbar and lumbosacral intervertebral disc disorder: Secondary | ICD-10-CM

## 2014-08-30 DIAGNOSIS — M47896 Other spondylosis, lumbar region: Secondary | ICD-10-CM | POA: Diagnosis not present

## 2014-08-30 DIAGNOSIS — M545 Low back pain: Secondary | ICD-10-CM | POA: Diagnosis present

## 2014-08-30 NOTE — Patient Instructions (Signed)
Please go downstairs for x-rays. I will call you with your results. Please continue stretching exercises and visits to the chiropractor. We will proceed with referral based on imaging results.

## 2014-08-30 NOTE — Telephone Encounter (Signed)
I will attempt to call Dr. Georgeanne NimBarbour at lunch when clinic is done.  An x-ray of his lumbar spine only was obtained as that is place where issues were mainly stemming from. Did not x-ray the cervical spine today. Again, recommend MRI lumbar spine to further assess but we can hold off until I speak with Dr. Georgeanne NimBarbour.

## 2014-08-30 NOTE — Progress Notes (Signed)
Pre visit review using our clinic review tool, if applicable. No additional management support is needed unless otherwise documented below in the visit note. 

## 2014-08-30 NOTE — Assessment & Plan Note (Signed)
Will obtain records from Dr. Georgeanne NimBarbour to review.  Will proceed with x-ray today. Some intermittent radicular symptoms. None at present.  Will proceed with MRI based on x-ray results.  Will then select specialist if necessary.  Supportive measures reviewed with patient.

## 2014-08-30 NOTE — Telephone Encounter (Signed)
Relation to pt: self Call back number: [phone]   Reason for call:  Pt wanted to discuss x-ray and ensure that the entire back was scanned rather then certain parts. Pt stated Dr. Britta MccreedyBarbara  [facility] would like to speak with Selena BattenCody in regards to prior imaging results  [phone]

## 2014-08-30 NOTE — Progress Notes (Signed)
Patient presents to clinic today to discuss referral to specialists.  Has been seeing chiropractor for low back pain.  Was told he had degenerative disease in back and needed to see a rheumatologist.   Denies history of rheumatoid arthritis.  Denies low back pain at present. Denies joint stiffness other than in lower back.  Denies back pain radiating into lower extremities.  Denies numbness, weakness or tingling.   Past Medical History  Diagnosis Date  . Hay fever   . Chicken pox   . Mumps   . Measles   . AR (allergic rhinitis)   . H/O vitamin D deficiency     Current Outpatient Prescriptions on File Prior to Visit  Medication Sig Dispense Refill  . chlorpheniramine (CHLOR-TRIMETON) 4 MG tablet Take 4 mg by mouth as needed for allergies.    Marland Kitchen. ibuprofen (ADVIL,MOTRIN) 200 MG tablet Take 200 mg by mouth as needed for pain.    . NON FORMULARY Take by mouth as directed. [employer] Prosta[employer] Supplement.    . NON FORMULARY [place] Chapter: Prostate 5XL Supplement    . Pseudoeph-Doxylamine-DM-APAP (NYQUIL MULTI-SYMPTOM PO) Take by mouth at bedtime as needed.    . pseudoephedrine (SUDAFED) 120 MG 12 hr tablet Take 120 mg by mouth as needed for congestion.     No current facility-administered medications on file prior to visit.    No Known Allergies  Family History  Problem Relation Age of Onset  . Hypertension Mother   . Uterine cancer Mother   . Thyroid disease Mother   . Thyroid disease Sister     x2  . Emphysema Father   . Lung disease Father   . Other Father     Blood Disorder  . Stroke Paternal Grandmother   . Heart disease Paternal Grandfather   . Heart disease Maternal Grandfather   . Obesity Sister   . Healthy Daughter     x2  . Diabetes Other   . Myelodysplastic syndrome Father     History   Social History  . Marital Status: Married    Spouse Name: N/A  . Number of Children: N/A  . Years of Education: N/A   Social History Main Topics  . Smoking status: Never  Smoker   . Smokeless tobacco: Never Used  . Alcohol Use: Yes     Comment: rare  . Drug Use: No  . Sexual Activity: Yes    Birth Control/ Protection: None   Other Topics Concern  . None   Social History Narrative   Review of Systems  Constitutional: Negative for malaise/fatigue.  Genitourinary: Negative for dysuria, urgency and frequency.  Musculoskeletal: Positive for back pain and joint pain. Negative for myalgias, falls and neck pain.  Neurological: Negative for dizziness, tingling, sensory change and loss of consciousness.   BP 153/101 mmHg  Pulse 91  Temp(Src) 98 F (36.7 C)  Wt 220 lb (99.791 kg)  SpO2 97%  Physical Exam  Constitutional: He is oriented to person, place, and time and well-developed, well-nourished, and in no distress.  HENT:  Head: Normocephalic and atraumatic.  Eyes: Conjunctivae are normal.  Cardiovascular: Normal rate, regular rhythm, normal heart sounds and intact distal pulses.   Pulmonary/Chest: Effort normal and breath sounds normal. No respiratory distress. He has no wheezes. He has no rales. He exhibits no tenderness.  Musculoskeletal: Normal range of motion. He exhibits no tenderness.  Neurological: He is alert and oriented to person, place, and time.  Skin: Skin is warm and dry.  No rash noted.  Psychiatric: Affect normal.  Vitals reviewed.  Assessment/Plan: Chronic back pain Will obtain records from Dr. Georgeanne NimBarbour to review.  Will proceed with x-ray today. Some intermittent radicular symptoms. None at present.  Will proceed with MRI based on x-ray results.  Will then select specialist if necessary.  Supportive measures reviewed with patient.

## 2014-08-31 ENCOUNTER — Encounter: Payer: Self-pay | Admitting: Physician Assistant

## 2014-08-31 ENCOUNTER — Other Ambulatory Visit: Payer: Self-pay | Admitting: Physician Assistant

## 2014-08-31 DIAGNOSIS — M47816 Spondylosis without myelopathy or radiculopathy, lumbar region: Secondary | ICD-10-CM

## 2014-08-31 DIAGNOSIS — M47814 Spondylosis without myelopathy or radiculopathy, thoracic region: Secondary | ICD-10-CM

## 2014-09-05 ENCOUNTER — Ambulatory Visit: Payer: BLUE CROSS/BLUE SHIELD | Admitting: Family Medicine

## 2014-12-29 ENCOUNTER — Telehealth: Payer: Self-pay | Admitting: *Deleted

## 2014-12-29 ENCOUNTER — Encounter: Payer: Self-pay | Admitting: *Deleted

## 2014-12-29 NOTE — Telephone Encounter (Signed)
Pre-Visit Call completed with patient and chart updated.   Pre-Visit Info documented in Specialty Comments under [employer].    

## 2015-01-02 ENCOUNTER — Telehealth: Payer: Self-pay | Admitting: *Deleted

## 2015-01-02 ENCOUNTER — Encounter: Payer: Self-pay | Admitting: Physician Assistant

## 2015-01-02 ENCOUNTER — Ambulatory Visit (INDEPENDENT_AMBULATORY_CARE_PROVIDER_SITE_OTHER): Payer: BLUE CROSS/BLUE SHIELD | Admitting: Physician Assistant

## 2015-01-02 VITALS — BP 116/78 | HR 76 | Temp 98.2°F | Resp 16 | Ht 73.0 in | Wt 218.4 lb

## 2015-01-02 DIAGNOSIS — Z125 Encounter for screening for malignant neoplasm of prostate: Secondary | ICD-10-CM | POA: Diagnosis not present

## 2015-01-02 DIAGNOSIS — R42 Dizziness and giddiness: Secondary | ICD-10-CM | POA: Insufficient documentation

## 2015-01-02 DIAGNOSIS — Z0000 Encounter for general adult medical examination without abnormal findings: Secondary | ICD-10-CM | POA: Diagnosis not present

## 2015-01-02 DIAGNOSIS — Z23 Encounter for immunization: Secondary | ICD-10-CM | POA: Diagnosis not present

## 2015-01-02 LAB — CBC
HEMATOCRIT: 43.7 % (ref 39.0–52.0)
Hemoglobin: 14.7 g/dL (ref 13.0–17.0)
MCHC: 33.6 g/dL (ref 30.0–36.0)
MCV: 83.8 fl (ref 78.0–100.0)
Platelets: 241 10*3/uL (ref 150.0–400.0)
RBC: 5.21 Mil/uL (ref 4.22–5.81)
RDW: 13.4 % (ref 11.5–15.5)
WBC: 4.8 10*3/uL (ref 4.0–10.5)

## 2015-01-02 LAB — URINALYSIS, ROUTINE W REFLEX MICROSCOPIC
BILIRUBIN URINE: NEGATIVE
HGB URINE DIPSTICK: NEGATIVE
Ketones, ur: NEGATIVE
LEUKOCYTES UA: NEGATIVE
NITRITE: NEGATIVE
RBC / HPF: NONE SEEN (ref 0–?)
Specific Gravity, Urine: 1.01 (ref 1.000–1.030)
TOTAL PROTEIN, URINE-UPE24: NEGATIVE
URINE GLUCOSE: NEGATIVE
UROBILINOGEN UA: 0.2 (ref 0.0–1.0)
WBC, UA: NONE SEEN (ref 0–?)
pH: 8 (ref 5.0–8.0)

## 2015-01-02 LAB — COMPREHENSIVE METABOLIC PANEL
ALT: 22 U/L (ref 0–53)
AST: 20 U/L (ref 0–37)
Albumin: 4.5 g/dL (ref 3.5–5.2)
Alkaline Phosphatase: 71 U/L (ref 39–117)
BUN: 13 mg/dL (ref 6–23)
CHLORIDE: 104 meq/L (ref 96–112)
CO2: 26 meq/L (ref 19–32)
CREATININE: 0.77 mg/dL (ref 0.40–1.50)
Calcium: 9 mg/dL (ref 8.4–10.5)
GFR: 109.4 mL/min (ref >60.00)
Glucose, Bld: 95 mg/dL (ref 70–99)
POTASSIUM: 4.1 meq/L (ref 3.5–5.1)
SODIUM: 137 meq/L (ref 135–145)
Total Bilirubin: 0.6 mg/dL (ref 0.2–1.2)
Total Protein: 6.9 g/dL (ref 6.0–8.3)

## 2015-01-02 LAB — LIPID PANEL
CHOLESTEROL: 215 mg/dL — AB (ref 0–200)
HDL: 32.6 mg/dL — ABNORMAL LOW (ref >39.00)
LDL Cholesterol: 161 mg/dL — ABNORMAL HIGH (ref 0–99)
NONHDL: 182.8
Total CHOL/HDL Ratio: 7
Triglycerides: 108 mg/dL (ref 0.0–149.0)
VLDL: 21.6 mg/dL (ref 0.0–40.0)

## 2015-01-02 LAB — HEMOGLOBIN A1C: HEMOGLOBIN A1C: 5.5 % (ref 4.6–6.5)

## 2015-01-02 LAB — TSH: TSH: 4.39 u[IU]/mL (ref 0.35–4.50)

## 2015-01-02 LAB — PSA: PSA: 0.7 ng/mL (ref 0.10–4.00)

## 2015-01-02 MED ORDER — ROSUVASTATIN CALCIUM 10 MG PO TABS: 10.0000 mg | ORAL_TABLET | Freq: Every day | ORAL | Status: DC

## 2015-01-02 NOTE — Assessment & Plan Note (Signed)
Depression screen negative. Health Maintenance reviewed -- Will get Shingles vaccine today.  Other HM up to date. Preventive schedule discussed and handout given in AVS. Will obtain fasting labs today.

## 2015-01-02 NOTE — Telephone Encounter (Signed)
Patient informed, understood & agreed; new Rx to pharmacy/SLS  

## 2015-01-02 NOTE — Telephone Encounter (Signed)
-----   Message from Waldon Merl, PA-C sent at 01/02/2015  4:22 PM EDT ----- Labs good overall. Cholesterol slightly high with LDL at 161. Want him to start a low dose cholesterol medication giving symptoms currently. Rx Crestor 10 mg daily. Ok to send in 30 day supply with 2 refills. Follow-up 3 months. I will call with his stress test and Echo results.

## 2015-01-02 NOTE — Progress Notes (Signed)
Pre visit review using our clinic review tool, if applicable. No additional management support is needed unless otherwise documented below in the visit note/SLS  

## 2015-01-02 NOTE — Patient Instructions (Signed)
Please go to the lab for blood work.  I will call you with your results. Please start an 81 mg Aspirin daily (Baby Aspirin).   You will be contacted for an Echocardiogram and a Stress Test. Please schedule these ASAP.  Stay well hydrated and do not skip meals. If symptoms acutely worsen, please call 911 or go to the ER.  Preventive Care for Adults A healthy lifestyle and preventive care can promote health and wellness. Preventive health guidelines for men include the following key practices:  A routine yearly physical is a good way to check with your health care provider about your health and preventative screening. It is a chance to share any concerns and updates on your health and to receive a thorough exam.  Visit your dentist for a routine exam and preventative care every 6 months. Brush your teeth twice a day and floss once a day. Good oral hygiene prevents tooth decay and gum disease.  The frequency of eye exams is based on your age, health, family medical history, use of contact lenses, and other factors. Follow your health care provider's recommendations for frequency of eye exams.  Eat a healthy diet. Foods such as vegetables, fruits, whole grains, low-fat dairy products, and lean protein foods contain the nutrients you need without too many calories. Decrease your intake of foods high in solid fats, added sugars, and salt. Eat the right amount of calories for you.Get information about a proper diet from your health care provider, if necessary.  Regular physical exercise is one of the most important things you can do for your health. Most adults should get at least 150 minutes of moderate-intensity exercise (any activity that increases your heart rate and causes you to sweat) each week. In addition, most adults need muscle-strengthening exercises on 2 or more days a week.  Maintain a healthy weight. The body mass index (BMI) is a screening tool to identify possible weight problems. It  provides an estimate of body fat based on height and weight. Your health care provider can find your BMI and can help you achieve or maintain a healthy weight.For adults 20 years and older:  A BMI below 18.5 is considered underweight.  A BMI of 18.5 to 24.9 is normal.  A BMI of 25 to 29.9 is considered overweight.  A BMI of 30 and above is considered obese.  Maintain normal blood lipids and cholesterol levels by exercising and minimizing your intake of saturated fat. Eat a balanced diet with plenty of fruit and vegetables. Blood tests for lipids and cholesterol should begin at age 81 and be repeated every 5 years. If your lipid or cholesterol levels are high, you are over 50, or you are at high risk for heart disease, you may need your cholesterol levels checked more frequently.Ongoing high lipid and cholesterol levels should be treated with medicines if diet and exercise are not working.  If you smoke, find out from your health care provider how to quit. If you do not use tobacco, do not start.  Lung cancer screening is recommended for adults aged 8-80 years who are at high risk for developing lung cancer because of a history of smoking. A yearly low-dose CT scan of the lungs is recommended for people who have at least a 30-pack-year history of smoking and are a current smoker or have quit within the past 15 years. A pack year of smoking is smoking an average of 1 pack of cigarettes a day for 1 year (for  example: 1 pack a day for 30 years or 2 packs a day for 15 years). Yearly screening should continue until the smoker has stopped smoking for at least 15 years. Yearly screening should be stopped for people who develop a health problem that would prevent them from having lung cancer treatment.  If you choose to drink alcohol, do not have more than 2 drinks per day. One drink is considered to be 12 ounces (355 mL) of beer, 5 ounces (148 mL) of wine, or 1.5 ounces (44 mL) of liquor.  Avoid use of  street drugs. Do not share needles with anyone. Ask for help if you need support or instructions about stopping the use of drugs.  High blood pressure causes heart disease and increases the risk of stroke. Your blood pressure should be checked at least every 1-2 years. Ongoing high blood pressure should be treated with medicines, if weight loss and exercise are not effective.  If you are 60-15 years old, ask your health care provider if you should take aspirin to prevent heart disease.  Diabetes screening involves taking a blood sample to check your fasting blood sugar level. This should be done once every 3 years, after age 10, if you are within normal weight and without risk factors for diabetes. Testing should be considered at a younger age or be carried out more frequently if you are overweight and have at least 1 risk factor for diabetes.  Colorectal cancer can be detected and often prevented. Most routine colorectal cancer screening begins at the age of 56 and continues through age 12. However, your health care provider may recommend screening at an earlier age if you have risk factors for colon cancer. On a yearly basis, your health care provider may provide home test kits to check for hidden blood in the stool. Use of a small camera at the end of a tube to directly examine the colon (sigmoidoscopy or colonoscopy) can detect the earliest forms of colorectal cancer. Talk to your health care provider about this at age 84, when routine screening begins. Direct exam of the colon should be repeated every 5-10 years through age 45, unless early forms of precancerous polyps or small growths are found.  People who are at an increased risk for hepatitis B should be screened for this virus. You are considered at high risk for hepatitis B if:  You were born in a country where hepatitis B occurs often. Talk with your health care provider about which countries are considered high risk.  Your parents were  born in a high-risk country and you have not received a shot to protect against hepatitis B (hepatitis B vaccine).  You have HIV or AIDS.  You use needles to inject street drugs.  You live with, or have sex with, someone who has hepatitis B.  You are a man who has sex with other men (MSM).  You get hemodialysis treatment.  You take certain medicines for conditions such as cancer, organ transplantation, and autoimmune conditions.  Hepatitis C blood testing is recommended for all people born from [birth year] through 1965 and any individual with known risks for hepatitis C.  Practice safe sex. Use condoms and avoid high-risk sexual practices to reduce the spread of sexually transmitted infections (STIs). STIs include gonorrhea, chlamydia, syphilis, trichomonas, herpes, HPV, and human immunodeficiency virus (HIV). Herpes, HIV, and HPV are viral illnesses that have no cure. They can result in disability, cancer, and death.  If you are at risk of  being infected with HIV, it is recommended that you take a prescription medicine daily to prevent HIV infection. This is called preexposure prophylaxis (PrEP). You are considered at risk if:  You are a man who has sex with other men (MSM) and have other risk factors.  You are a heterosexual man, are sexually active, and are at increased risk for HIV infection.  You take drugs by injection.  You are sexually active with a partner who has HIV.  Talk with your health care provider about whether you are at high risk of being infected with HIV. If you choose to begin PrEP, you should first be tested for HIV. You should then be tested every 3 months for as long as you are taking PrEP.  A one-time screening for abdominal aortic aneurysm (AAA) and surgical repair of large AAAs by ultrasound are recommended for men ages 47 to 23 years who are current or former smokers.  Healthy men should no longer receive prostate-specific antigen (PSA) blood tests as part of  routine cancer screening. Talk with your health care provider about prostate cancer screening.  Testicular cancer screening is not recommended for adult males who have no symptoms. Screening includes self-exam, a health care provider exam, and other screening tests. Consult with your health care provider about any symptoms you have or any concerns you have about testicular cancer.  Use sunscreen. Apply sunscreen liberally and repeatedly throughout the day. You should seek shade when your shadow is shorter than you. Protect yourself by wearing long sleeves, pants, a wide-brimmed hat, and sunglasses year round, whenever you are outdoors.  Once a month, do a whole-body skin exam, using a mirror to look at the skin on your back. Tell your health care provider about new moles, moles that have irregular borders, moles that are larger than a pencil eraser, or moles that have changed in shape or color.  Stay current with required vaccines (immunizations).  Influenza vaccine. All adults should be immunized every year.  Tetanus, diphtheria, and acellular pertussis (Td, Tdap) vaccine. An adult who has not previously received Tdap or who does not know his vaccine status should receive 1 dose of Tdap. This initial dose should be followed by tetanus and diphtheria toxoids (Td) booster doses every 10 years. Adults with an unknown or incomplete history of completing a 3-dose immunization series with Td-containing vaccines should begin or complete a primary immunization series including a Tdap dose. Adults should receive a Td booster every 10 years.  Varicella vaccine. An adult without evidence of immunity to varicella should receive 2 doses or a second dose if he has previously received 1 dose.  Human papillomavirus (HPV) vaccine. Males aged 55-21 years who have not received the vaccine previously should receive the 3-dose series. Males aged 22-26 years may be immunized. Immunization is recommended through the age  of 62 years for any male who has sex with males and did not get any or all doses earlier. Immunization is recommended for any person with an immunocompromised condition through the age of 47 years if he did not get any or all doses earlier. During the 3-dose series, the second dose should be obtained 4-8 weeks after the first dose. The third dose should be obtained 24 weeks after the first dose and 16 weeks after the second dose.  Zoster vaccine. One dose is recommended for adults aged 58 years or older unless certain conditions are present.  Measles, mumps, and rubella (MMR) vaccine. Adults born before [birth year] generally  are considered immune to measles and mumps. Adults born in [birth year] or later should have 1 or more doses of MMR vaccine unless there is a contraindication to the vaccine or there is laboratory evidence of immunity to each of the three diseases. A routine second dose of MMR vaccine should be obtained at least 28 days after the first dose for students attending postsecondary schools, health care workers, or international travelers. People who received inactivated measles vaccine or an unknown type of measles vaccine during 1963-1967 should receive 2 doses of MMR vaccine. People who received inactivated mumps vaccine or an unknown type of mumps vaccine before 1979 and are at high risk for mumps infection should consider immunization with 2 doses of MMR vaccine. Unvaccinated health care workers born before [birth year] who lack laboratory evidence of measles, mumps, or rubella immunity or laboratory confirmation of disease should consider measles and mumps immunization with 2 doses of MMR vaccine or rubella immunization with 1 dose of MMR vaccine.  Pneumococcal 13-valent conjugate (PCV13) vaccine. When indicated, a person who is uncertain of his immunization history and has no record of immunization should receive the PCV13 vaccine. An adult aged 60 years or older who has certain medical conditions and has not  been previously immunized should receive 1 dose of PCV13 vaccine. This PCV13 should be followed with a dose of pneumococcal polysaccharide (PPSV23) vaccine. The PPSV23 vaccine dose should be obtained at least 8 weeks after the dose of PCV13 vaccine. An adult aged 61 years or older who has certain medical conditions and previously received 1 or more doses of PPSV23 vaccine should receive 1 dose of PCV13. The PCV13 vaccine dose should be obtained 1 or more years after the last PPSV23 vaccine dose.  Pneumococcal polysaccharide (PPSV23) vaccine. When PCV13 is also indicated, PCV13 should be obtained first. All adults aged 72 years and older should be immunized. An adult younger than age 59 years who has certain medical conditions should be immunized. Any person who resides in a nursing home or long-term care facility should be immunized. An adult smoker should be immunized. People with an immunocompromised condition and certain other conditions should receive both PCV13 and PPSV23 vaccines. People with human immunodeficiency virus (HIV) infection should be immunized as soon as possible after diagnosis. Immunization during chemotherapy or radiation therapy should be avoided. Routine use of PPSV23 vaccine is not recommended for American Indians, [place] Natives, or people younger than 65 years unless there are medical conditions that require PPSV23 vaccine. When indicated, people who have unknown immunization and have no record of immunization should receive PPSV23 vaccine. One-time revaccination 5 years after the first dose of PPSV23 is recommended for people aged 19-64 years who have chronic kidney failure, nephrotic syndrome, asplenia, or immunocompromised conditions. People who received 1-2 doses of PPSV23 before age 50 years should receive another dose of PPSV23 vaccine at age 89 years or later if at least 5 years have passed since the previous dose. Doses of PPSV23 are not needed for people immunized with PPSV23 at  or after age 5 years.  Meningococcal vaccine. Adults with asplenia or persistent complement component deficiencies should receive 2 doses of quadrivalent meningococcal conjugate (MenACWY-D) vaccine. The doses should be obtained at least 2 months apart. Microbiologists working with certain meningococcal bacteria, [employer] recruits, people at risk during an outbreak, and people who travel to or live in countries with a high rate of meningitis should be immunized. A first-year college student up through age 20 years who is  living in a residence hall should receive a dose if he did not receive a dose on or after his 16th birthday. Adults who have certain high-risk conditions should receive one or more doses of vaccine.  Hepatitis A vaccine. Adults who wish to be protected from this disease, have certain high-risk conditions, work with hepatitis A-infected animals, work in hepatitis A research labs, or travel to or work in countries with a high rate of hepatitis A should be immunized. Adults who were previously unvaccinated and who anticipate close contact with an international adoptee during the first 60 days after arrival in the [place] from a country with a high rate of hepatitis A should be immunized.  Hepatitis B vaccine. Adults should be immunized if they wish to be protected from this disease, have certain high-risk conditions, may be exposed to blood or other infectious body fluids, are household contacts or sex partners of hepatitis B positive people, are clients or workers in certain care facilities, or travel to or work in countries with a high rate of hepatitis B.  Haemophilus influenzae type b (Hib) vaccine. A previously unvaccinated person with asplenia or sickle cell disease or having a scheduled splenectomy should receive 1 dose of Hib vaccine. Regardless of previous immunization, a recipient of a hematopoietic stem cell transplant should receive a 3-dose series 6-12 months after his  successful transplant. Hib vaccine is not recommended for adults with HIV infection. Preventive Service / Frequency Ages 7 to 47  Blood pressure check.** / Every 1 to 2 years.  Lipid and cholesterol check.** / Every 5 years beginning at age 64.  Hepatitis C blood test.** / For any individual with known risks for hepatitis C.  Skin self-exam. / Monthly.  Influenza vaccine. / Every year.  Tetanus, diphtheria, and acellular pertussis (Tdap, Td) vaccine.** / Consult your health care provider. 1 dose of Td every 10 years.  Varicella vaccine.** / Consult your health care provider.  HPV vaccine. / 3 doses over 6 months, if 81 or younger.  Measles, mumps, rubella (MMR) vaccine.** / You need at least 1 dose of MMR if you were born in 1957 or later. You may also need a second dose.  Pneumococcal 13-valent conjugate (PCV13) vaccine.** / Consult your health care provider.  Pneumococcal polysaccharide (PPSV23) vaccine.** / 1 to 2 doses if you smoke cigarettes or if you have certain conditions.  Meningococcal vaccine.** / 1 dose if you are age 87 to 85 years and a Market researcher living in a residence hall, or have one of several medical conditions. You may also need additional booster doses.  Hepatitis A vaccine.** / Consult your health care provider.  Hepatitis B vaccine.** / Consult your health care provider.  Haemophilus influenzae type b (Hib) vaccine.** / Consult your health care provider. Ages 18 to 67  Blood pressure check.** / Every 1 to 2 years.  Lipid and cholesterol check.** / Every 5 years beginning at age 52.  Lung cancer screening. / Every year if you are aged 55-80 years and have a 30-pack-year history of smoking and currently smoke or have quit within the past 15 years. Yearly screening is stopped once you have quit smoking for at least 15 years or develop a health problem that would prevent you from having lung cancer treatment.  Fecal occult blood test (FOBT)  of stool. / Every year beginning at age 75 and continuing until age 24. You may not have to do this test if you get a colonoscopy  every 10 years.  Flexible sigmoidoscopy** or colonoscopy.** / Every 5 years for a flexible sigmoidoscopy or every 10 years for a colonoscopy beginning at age 63 and continuing until age 38.  Hepatitis C blood test.** / For all people born from [birth year] through 1965 and any individual with known risks for hepatitis C.  Skin self-exam. / Monthly.  Influenza vaccine. / Every year.  Tetanus, diphtheria, and acellular pertussis (Tdap/Td) vaccine.** / Consult your health care provider. 1 dose of Td every 10 years.  Varicella vaccine.** / Consult your health care provider.  Zoster vaccine.** / 1 dose for adults aged 65 years or older.  Measles, mumps, rubella (MMR) vaccine.** / You need at least 1 dose of MMR if you were born in 1958 or later. You may also need a second dose.  Pneumococcal 13-valent conjugate (PCV13) vaccine.** / Consult your health care provider.  Pneumococcal polysaccharide (PPSV23) vaccine.** / 1 to 2 doses if you smoke cigarettes or if you have certain conditions.  Meningococcal vaccine.** / Consult your health care provider.  Hepatitis A vaccine.** / Consult your health care provider.  Hepatitis B vaccine.** / Consult your health care provider.  Haemophilus influenzae type b (Hib) vaccine.** / Consult your health care provider. Ages 49 and over  Blood pressure check.** / Every 1 to 2 years.  Lipid and cholesterol check.**/ Every 5 years beginning at age 67.  Lung cancer screening. / Every year if you are aged 3-80 years and have a 30-pack-year history of smoking and currently smoke or have quit within the past 15 years. Yearly screening is stopped once you have quit smoking for at least 15 years or develop a health problem that would prevent you from having lung cancer treatment.  Fecal occult blood test (FOBT) of stool. / Every year  beginning at age 73 and continuing until age 48. You may not have to do this test if you get a colonoscopy every 10 years.  Flexible sigmoidoscopy** or colonoscopy.** / Every 5 years for a flexible sigmoidoscopy or every 10 years for a colonoscopy beginning at age 110 and continuing until age 32.  Hepatitis C blood test.** / For all people born from [birth year] through 1966 and any individual with known risks for hepatitis C.  Abdominal aortic aneurysm (AAA) screening.** / A one-time screening for ages 23 to 75 years who are current or former smokers.  Skin self-exam. / Monthly.  Influenza vaccine. / Every year.  Tetanus, diphtheria, and acellular pertussis (Tdap/Td) vaccine.** / 1 dose of Td every 10 years.  Varicella vaccine.** / Consult your health care provider.  Zoster vaccine.** / 1 dose for adults aged 57 years or older.  Pneumococcal 13-valent conjugate (PCV13) vaccine.** / Consult your health care provider.  Pneumococcal polysaccharide (PPSV23) vaccine.** / 1 dose for all adults aged 63 years and older.  Meningococcal vaccine.** / Consult your health care provider.  Hepatitis A vaccine.** / Consult your health care provider.  Hepatitis B vaccine.** / Consult your health care provider.  Haemophilus influenzae type b (Hib) vaccine.** / Consult your health care provider. **Family history and personal history of risk and conditions may change your health care provider's recommendations. Document Released: 06/10/2001 Document Revised: 04/19/2013 Document Reviewed: 09/09/2010 [facility] Patient Information 2015 [place], [place]. This information is not intended to replace advice given to you by your health care provider. Make sure you discuss any questions you have with your health care provider.

## 2015-01-02 NOTE — Assessment & Plan Note (Signed)
Exam unremarkable. No carotid bruits auscultated. 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 Vitals stable. Will obtain CBC, CMP, TSH and lipid panel. Will refer to Cardiology and obtain EST and Echocardiogram in the mean time. Patient to begin 81 mg ASA daily. Continue normal activities but do not begin new exercise regimen. Stay hydrated by avoid caffeine. Rest. If anything worsens, call 911.

## 2015-01-02 NOTE — Assessment & Plan Note (Signed)
Will obtain screening PSA today. 

## 2015-01-02 NOTE — Assessment & Plan Note (Signed)
Zostavax given by nursing staff. 

## 2015-01-02 NOTE — Progress Notes (Signed)
Patient presents to clinic today for annual exam.  Patient is fasting for labs.  Acute Concerns: Patient c/o episodes of lightheadedness and dizziness occuring once daily over the past several months. Denies chest pain, palpitations or SOB when these episodes occur but does note some SOB on exertion. Denies wheezing or chest tightness. Endorses some mild chest discomfort with SOB on exertion. Is non radiating. Denies symptoms at present. Denies change in diet. Endorses good hydration.  Chronic Issues: BPH -- Patient previously declined BPH medications. Is getting PSA checked yearly. Last PSA on 02/15/14 at 0.77.  Denies new or worsening symptoms.  Allergic Rhinitis -- Endorses well controlled with OTC antihistamines.  Health Maintenance: Dental -- up-to-date Vision -- up-to-date Immunizations -- Up-to-date Colonoscopy -- Last in 2008. Will be due for repeat in 2018.  Past Medical History  Diagnosis Date  . Hay fever   . Chicken pox   . Mumps   . Measles   . AR (allergic rhinitis)   . H/O vitamin D deficiency   . Allergy     dust, mold    Past Surgical History  Procedure Laterality Date  . Tonsillectomy and adenoidectomy  1959  . Wisdom tooth extraction    . Root canal  07/28/14    Current Outpatient Prescriptions on File Prior to Visit  Medication Sig Dispense Refill  . chlorpheniramine (CHLOR-TRIMETON) 4 MG tablet Take 4 mg by mouth as needed for allergies.    Marland Kitchen ibuprofen (ADVIL,MOTRIN) 200 MG tablet Take 200 mg by mouth as needed for pain.    . Multiple Vitamin (MULTIVITAMIN) tablet Take 1 tablet by mouth daily.    . NON FORMULARY Take by mouth as directed. [employer]    . NON FORMULARY New Chapter: Prostate 5LX Supplement    . OVER THE COUNTER MEDICATION Take 1 tablet by mouth daily. VSC- by Nature's Sunshine    . Pseudoeph-Doxylamine-DM-APAP (NYQUIL MULTI-SYMPTOM PO) Take by mouth at bedtime as needed.    . vitamin C (ASCORBIC ACID) 500 MG  tablet Take 500 mg by mouth daily.     No current facility-administered medications on file prior to visit.    No Known Allergies  Family History  Problem Relation Age of Onset  . Hypertension Mother   . Uterine cancer Mother   . Thyroid disease Mother   . Thyroid disease Sister     x2  . Emphysema Father   . Lung disease Father   . Other Father     Blood Disorder  . Stroke Paternal Grandmother   . Heart disease Paternal Grandfather   . Heart disease Maternal Grandfather   . Obesity Sister   . Healthy Daughter     x2  . Diabetes Other   . Myelodysplastic syndrome Father     Social History   Social History  . Marital Status: Married    Spouse Name: N/A  . Number of Children: N/A  . Years of Education: N/A   Occupational History  . Not on file.   Social History Main Topics  . Smoking status: Never Smoker   . Smokeless tobacco: Never Used  . Alcohol Use: Yes     Comment: rare  . Drug Use: No  . Sexual Activity: Yes    Birth Control/ Protection: None   Other Topics Concern  . Not on file   Social History Narrative   Review of Systems  Constitutional: Negative for fever and weight loss.  HENT: Negative for ear discharge,  ear pain, hearing loss and tinnitus.   Eyes: Negative for blurred vision, double vision, photophobia and pain.  Respiratory: Positive for shortness of breath. Negative for cough.   Cardiovascular: Positive for chest pain. Negative for palpitations.  Gastrointestinal: Negative for heartburn, nausea, vomiting, abdominal pain, diarrhea, constipation, blood in stool and melena.  Genitourinary: Negative for dysuria, urgency, frequency, hematuria and flank pain.  Musculoskeletal: Negative for falls.  Neurological: Positive for dizziness. Negative for loss of consciousness and headaches.  Endo/Heme/Allergies: Negative for environmental allergies.  Psychiatric/Behavioral: Negative for depression, suicidal ideas, hallucinations and substance abuse.  The patient is not nervous/anxious and does not have insomnia.    BP 116/78 mmHg  Pulse 76  Temp(Src) 98.2 F (36.8 C) (Oral)  Resp 16  Ht 6\' 1"  (1.854 m)  Wt 218 lb 6 oz (99.054 kg)  BMI 28.82 kg/m2  SpO2 97%  Physical Exam  Constitutional: He is oriented to person, place, and time and well-developed, well-nourished, and in no distress.  HENT:  Head: Normocephalic and atraumatic.  Right Ear: External ear normal.  Left Ear: External ear normal.  Nose: Nose normal.  Mouth/Throat: Oropharynx is clear and moist. No oropharyngeal exudate.  Eyes: Conjunctivae and EOM are normal. Pupils are equal, round, and reactive to light.  Neck: Neck supple. No thyromegaly present.  Cardiovascular: Normal rate, regular rhythm, normal heart sounds and intact distal pulses.   Pulmonary/Chest: Effort normal and breath sounds normal. No respiratory distress. He has no wheezes. He has no rales. He exhibits no tenderness.  Abdominal: Soft. Bowel sounds are normal. He exhibits no distension and no mass. There is no tenderness. There is no rebound and no guarding.  Genitourinary: Testes/scrotum normal and penis normal. No discharge found.  Lymphadenopathy:    He has no cervical adenopathy.  Neurological: He is alert and oriented to person, place, and time.  Skin: Skin is warm and dry. No rash noted.  Psychiatric: Affect normal.  Vitals reviewed.  Assessment/Plan: Dizziness and giddiness Exam unremarkable. No carotid bruits auscultated. 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 Vitals stable. Will obtain CBC, CMP, TSH and lipid panel. Will refer to Cardiology and obtain EST and Echocardiogram in the mean time. Patient to begin 81 mg ASA daily. Continue normal activities but do not begin new exercise regimen. Stay hydrated by avoid caffeine. Rest. If anything worsens, call 911.  Prostate cancer screening Will obtain screening PSA today.  Visit for preventive health examination Depression  screen negative. Health Maintenance reviewed -- Will get Shingles vaccine today.  Other HM up to date. Preventive schedule discussed and handout given in AVS. Will obtain fasting labs today.   Need for shingles vaccine Zostavax given by nursing staff.

## 2015-01-04 ENCOUNTER — Ambulatory Visit (HOSPITAL_COMMUNITY)
Admission: RE | Admit: 2015-01-04 | Discharge: 2015-01-04 | Disposition: A | Discharge status: Home / Self Care | Payer: BLUE CROSS/BLUE SHIELD | Source: Ambulatory Visit | Attending: Physician Assistant | Admitting: Physician Assistant

## 2015-01-04 DIAGNOSIS — R42 Dizziness and giddiness: Secondary | ICD-10-CM

## 2015-01-05 LAB — EXERCISE TOLERANCE TEST
CHL RATE OF PERCEIVED EXERTION: 17
CSEPED: 8 min
CSEPEW: 10.1 METS
CSEPHR: 93 %
CSEPPHR: 150 {beats}/min
MPHR: 160 {beats}/min
Rest HR: 86 {beats}/min

## 2015-01-09 ENCOUNTER — Other Ambulatory Visit: Payer: Self-pay

## 2015-01-09 ENCOUNTER — Ambulatory Visit (HOSPITAL_COMMUNITY): Payer: BLUE CROSS/BLUE SHIELD | Attending: Cardiology

## 2015-01-09 DIAGNOSIS — I371 Nonrheumatic pulmonary valve insufficiency: Secondary | ICD-10-CM | POA: Insufficient documentation

## 2015-01-09 DIAGNOSIS — I77810 Thoracic aortic ectasia: Secondary | ICD-10-CM | POA: Diagnosis not present

## 2015-01-09 DIAGNOSIS — R079 Chest pain, unspecified: Secondary | ICD-10-CM | POA: Insufficient documentation

## 2015-01-09 DIAGNOSIS — I5189 Other ill-defined heart diseases: Secondary | ICD-10-CM | POA: Insufficient documentation

## 2015-01-09 DIAGNOSIS — I071 Rheumatic tricuspid insufficiency: Secondary | ICD-10-CM | POA: Insufficient documentation

## 2015-01-09 DIAGNOSIS — Z8249 Family history of ischemic heart disease and other diseases of the circulatory system: Secondary | ICD-10-CM | POA: Insufficient documentation

## 2015-01-09 DIAGNOSIS — R42 Dizziness and giddiness: Secondary | ICD-10-CM

## 2015-02-19 ENCOUNTER — Other Ambulatory Visit: Payer: Self-pay | Admitting: Physician Assistant

## 2015-02-19 ENCOUNTER — Encounter: Payer: Self-pay | Admitting: Physician Assistant

## 2015-02-19 DIAGNOSIS — R0602 Shortness of breath: Secondary | ICD-10-CM

## 2015-02-19 DIAGNOSIS — R0789 Other chest pain: Secondary | ICD-10-CM

## 2015-02-19 DIAGNOSIS — I5032 Chronic diastolic (congestive) heart failure: Secondary | ICD-10-CM

## 2015-02-28 ENCOUNTER — Encounter: Payer: Self-pay | Admitting: Physician Assistant

## 2015-03-01 ENCOUNTER — Encounter: Payer: Self-pay | Admitting: Physician Assistant

## 2015-03-01 ENCOUNTER — Other Ambulatory Visit: Payer: Self-pay | Admitting: Physician Assistant

## 2015-03-01 ENCOUNTER — Ambulatory Visit (HOSPITAL_BASED_OUTPATIENT_CLINIC_OR_DEPARTMENT_OTHER)
Admission: RE | Admit: 2015-03-01 | Discharge: 2015-03-01 | Disposition: A | Discharge status: Home / Self Care | Payer: BLUE CROSS/BLUE SHIELD | Source: Ambulatory Visit | Attending: Physician Assistant | Admitting: Physician Assistant

## 2015-03-01 DIAGNOSIS — R0602 Shortness of breath: Secondary | ICD-10-CM | POA: Diagnosis not present

## 2015-03-01 DIAGNOSIS — Z7689 Persons encountering health services in other specified circumstances: Secondary | ICD-10-CM

## 2015-03-06 NOTE — Progress Notes (Addendum)
Patient ID: Steve Schwartz, male   DOB: 04/19/1956, 59 y.o.   MRN: [id number]     Cardiology Office Note   Date:  03/06/2015   ID:  Steve Schwartz, DOB 02/01/1956, MRN [id number]  PCP:  Piedad ClimesMartin, William Cody, PA-C  Cardiologist:   Charlton HawsPeter Tayana Shankle, MD   No chief complaint on file.     History of Present Illness: Steve Schwartz is a 59 y.o. male who presents for evaluation of dizzyness and abnormal ECG:  Patient c/o episodes of lightheadedness and dizziness occuring once daily over the past several months. Denies chest pain, palpitations or SOB when these episodes occur but does note some SOB on exertion. Denies wheezing or chest tightness. Endorses some mild chest discomfort with SOB on exertion. Is non radiating. Denies symptoms at present  Echo done 01/09/15 essentially normal and reviewed   Study Conclusions  - Left ventricle: The cavity size was normal. Wall thickness was normal. Systolic function was normal. The estimated ejection fraction was in the range of 55% to 60%. Wall motion was normal; there were no regional wall motion abnormalities. Doppler parameters are consistent with abnormal left ventricular relaxation (grade 1 diastolic dysfunction). - Aortic root: The aortic root was mildly dilated. - Ascending aorta: The ascending aorta was mildly dilated.  Impressions:  - Normal LV function; grade 1 diastolic dysfunction; mildly dilated aortic root and ascending aorta; trace TR.  9/8 :  Had normal ETT achieving over 10 mets with max HR 150  And some HTN response  He appears to have reactive airway disease.  Was wheezing in clinic today Atypical sharp pain pleuritic and likely related to bronchiolar inflammation.  Has seasonal  Allergies.  Non smoker.  No occupational exposures   LDL is very high Has not been Rx.  Has not filled script for statin.    Past Medical History  Diagnosis Date  . Hay fever   . Chicken pox   . Mumps   . Measles   . AR (allergic  rhinitis)   . H/O vitamin D deficiency   . Allergy     dust, mold    Past Surgical History  Procedure Laterality Date  . Tonsillectomy and adenoidectomy  1959  . Wisdom tooth extraction    . Root canal  07/28/14     Current Outpatient Prescriptions  Medication Sig Dispense Refill  . chlorpheniramine (CHLOR-TRIMETON) 4 MG tablet Take 4 mg by mouth as needed for allergies.    Marland Kitchen. ibuprofen (ADVIL,MOTRIN) 200 MG tablet Take 200 mg by mouth as needed for pain.    . Multiple Vitamin (MULTIVITAMIN) tablet Take 1 tablet by mouth daily.    . NON FORMULARY Take by mouth as directed. [employer] ProstaCare Supplement.    . NON FORMULARY New Chapter: Prostate 5LX Supplement    . OVER THE COUNTER MEDICATION Take 1 tablet by mouth daily. VSC- by [employer] Sunshine    . Pseudoeph-Doxylamine-DM-APAP (NYQUIL MULTI-SYMPTOM PO) Take by mouth at bedtime as needed.    . pseudoephedrine (SUDAFED) 30 MG tablet Take 30 mg by mouth every 4 (four) hours as needed for congestion (for Flying).    . rosuvastatin (CRESTOR) 10 MG tablet Take 1 tablet (10 mg total) by mouth daily. 30 tablet 2  . vitamin C (ASCORBIC ACID) 500 MG tablet Take 500 mg by mouth daily.     No current facility-administered medications for this visit.    Allergies:   Review of patient's allergies indicates no known allergies.    Social History:  The patient  reports that he has never smoked. He has never used smokeless tobacco. He reports that he drinks alcohol. He reports that he does not use illicit drugs.   Family History:  The patient's family history includes Diabetes in his other; Emphysema in his father; Healthy in his daughter; Heart disease in his maternal grandfather and paternal grandfather; Hypertension in his mother; Lung disease in his father; Myelodysplastic syndrome in his father; Obesity in his sister; Other in his father; Stroke in his paternal grandmother; Thyroid disease in his mother and sister; Uterine cancer in his mother.     ROS:  Please see the history of present illness.   Otherwise, review of systems are positive for none.   All other systems are reviewed and negative.    PHYSICAL EXAM: VS:  There were no vitals taken for this visit. , BMI There is no weight on file to calculate BMI. Affect appropriate Healthy:  appears stated age HEENT: normal Neck supple with no adenopathy JVP normal no bruits no thyromegaly Lungs clear with  wheezing and good diaphragmatic motion Heart:  S1/S2 no murmur, no rub, gallop or click PMI normal Abdomen: benighn, BS positve, no tenderness, no AAA no bruit.  No HSM or HJR Distal pulses intact with no bruits No edema Neuro non-focal Skin warm and dry No muscular weakness    EKG:   01/02/15  SR rate 69  LAFB ICRBBB    Recent Labs: 01/02/2015: ALT 22; BUN 13; Creatinine, Ser 0.77; Hemoglobin 14.7; Platelets 241.0; Potassium 4.1; Sodium 137; TSH 4.39    Lipid Panel    Component Value Date/Time   CHOL 215* 01/02/2015 1047   TRIG 108.0 01/02/2015 1047   HDL 32.60* 01/02/2015 1047   CHOLHDL 7 01/02/2015 1047   CHOLHDL 7.2 02/15/2014 1103   VLDL 21.6 01/02/2015 1047   LDLCALC 161* 01/02/2015 1047   LDLDIRECT 174* 02/15/2014 1103      Wt Readings from Last 3 Encounters:  01/02/15 99.054 kg (218 lb 6 oz)  08/30/14 99.791 kg (220 lb)  02/15/14 97.637 kg (215 lb 4 oz)      Other studies Reviewed: Additional studies/ records that were reviewed today include: Primary office note Epic notes, ECG, Echo ETT and labs .    ASSESSMENT AND PLAN:  1.  Chest pain atypical normal ETT.  Discussed getting coronary calcium score.  ETT would suggest no obstructive disease but calium score will identify any disease That would help guide Rx cholesterol 2. Cholesterol.  Calcium score today if not 0 encouraged him to start statin 3. Dyspnea; clearly has reactive airway disease. Will call in rescue inhaler.  Refer to pulmonary  And check PFTls pre / post  bronchodilator   Addendum:;; Reviewed calcium score  82 with calcium seen in mid/distal LAD and mid/distal RCA  57th percentile for age and sex matched controls Start statin prescribed by primary     Current medicines are reviewed at length with the patient today.  The patient does not have concerns regarding medicines.  The following changes have been made:  Albuterol  Labs/ tests ordered today include: PFTls  Calium Score   No orders of the defined types were placed in this encounter.     Disposition:   FU with me in 3 months      Signed, Charlton Haws, MD  03/06/2015 2:21 PM    [facility] [address] [place], [place], [place]  [address] Phone: [phone]; Fax: [phone]

## 2015-03-07 ENCOUNTER — Encounter: Payer: Self-pay | Admitting: Cardiovascular Disease

## 2015-03-07 ENCOUNTER — Encounter: Payer: Self-pay | Admitting: Physician Assistant

## 2015-03-07 ENCOUNTER — Ambulatory Visit (INDEPENDENT_AMBULATORY_CARE_PROVIDER_SITE_OTHER): Payer: BLUE CROSS/BLUE SHIELD | Admitting: Cardiovascular Disease

## 2015-03-07 ENCOUNTER — Ambulatory Visit (INDEPENDENT_AMBULATORY_CARE_PROVIDER_SITE_OTHER)
Admission: RE | Admit: 2015-03-07 | Discharge: 2015-03-07 | Disposition: A | Discharge status: Home / Self Care | Payer: Self-pay | Source: Ambulatory Visit | Attending: Cardiovascular Disease | Admitting: Cardiovascular Disease

## 2015-03-07 VITALS — BP 146/88 | HR 86 | Ht 73.0 in | Wt 223.8 lb

## 2015-03-07 DIAGNOSIS — R42 Dizziness and giddiness: Secondary | ICD-10-CM

## 2015-03-07 DIAGNOSIS — J45909 Unspecified asthma, uncomplicated: Secondary | ICD-10-CM

## 2015-03-07 MED ORDER — ALBUTEROL SULFATE HFA 108 (90 BASE) MCG/ACT IN AERS: 2.0000 | INHALATION_SPRAY | Freq: Four times a day (QID) | RESPIRATORY_TRACT | Status: DC | PRN

## 2015-03-07 NOTE — Patient Instructions (Addendum)
Medication Instructions:  Your physician has recommended you make the following change in your medication:  1- START Albuterol inhaler as needed for wheezing  Labwork: NONE  Testing/Procedures: Your physician has requested that you have cardiac CT Calcium Score. Cardiac computed tomography (CT) is a painless test that uses an x-ray machine to take clear, detailed pictures of your heart. For further information please visit [email or web address]. Please follow instruction sheet as given.  Your physician has recommended that you have a pulmonary function test. Pulmonary Function Tests are a group of tests that measure how well air moves in and out of your lungs.   Follow-Up: Your physician recommends that you schedule a follow-up appointment in 3 months with Dr. Eden EmmsNishan.  You have been referred to Pulmonary doctor Dr. Maple HudsonYoung.   If you need a refill on your cardiac medications before your next appointment, please call your pharmacy.

## 2015-03-18 ENCOUNTER — Encounter: Payer: Self-pay | Admitting: Physician Assistant

## 2015-03-18 MED ORDER — ROSUVASTATIN CALCIUM 10 MG PO TABS: 10.0000 mg | ORAL_TABLET | Freq: Every day | ORAL | Status: DC

## 2015-04-03 ENCOUNTER — Encounter: Payer: Self-pay | Admitting: Physician Assistant

## 2015-04-09 ENCOUNTER — Ambulatory Visit (HOSPITAL_COMMUNITY)
Admission: RE | Admit: 2015-04-09 | Discharge: 2015-04-09 | Disposition: A | Discharge status: Home / Self Care | Payer: BLUE CROSS/BLUE SHIELD | Source: Ambulatory Visit | Attending: Cardiovascular Disease | Admitting: Cardiovascular Disease

## 2015-04-09 ENCOUNTER — Ambulatory Visit (INDEPENDENT_AMBULATORY_CARE_PROVIDER_SITE_OTHER): Payer: BLUE CROSS/BLUE SHIELD | Admitting: Pulmonary Disease

## 2015-04-09 ENCOUNTER — Encounter: Payer: Self-pay | Admitting: Pulmonary Disease

## 2015-04-09 VITALS — BP 132/70 | HR 77 | Temp 98.8°F | Ht 72.0 in | Wt 221.2 lb

## 2015-04-09 DIAGNOSIS — R42 Dizziness and giddiness: Secondary | ICD-10-CM | POA: Diagnosis not present

## 2015-04-09 DIAGNOSIS — R0689 Other abnormalities of breathing: Secondary | ICD-10-CM | POA: Diagnosis not present

## 2015-04-09 DIAGNOSIS — J45909 Unspecified asthma, uncomplicated: Secondary | ICD-10-CM | POA: Insufficient documentation

## 2015-04-09 DIAGNOSIS — R0600 Dyspnea, unspecified: Secondary | ICD-10-CM

## 2015-04-09 LAB — PULMONARY FUNCTION TEST
DL/VA % PRED: 84 %
DL/VA: 4.03 ml/min/mmHg/L
DLCO COR: 29.32 ml/min/mmHg
DLCO UNC % PRED: 83 %
DLCO cor % pred: 82 %
DLCO unc: 29.8 ml/min/mmHg
FEF 25-75 PRE: 3.17 L/s
FEF 25-75 Post: 4.03 L/sec
FEF2575-%CHANGE-POST: 27 %
FEF2575-%PRED-POST: 126 %
FEF2575-%PRED-PRE: 99 %
FEV1-%Change-Post: 5 %
FEV1-%PRED-PRE: 97 %
FEV1-%Pred-Post: 103 %
FEV1-POST: 4.04 L
FEV1-PRE: 3.83 L
FEV1FVC-%CHANGE-POST: 1 %
FEV1FVC-%Pred-Pre: 99 %
FEV6-%CHANGE-POST: 4 %
FEV6-%PRED-PRE: 101 %
FEV6-%Pred-Post: 106 %
FEV6-PRE: 5.02 L
FEV6-Post: 5.25 L
FEV6FVC-%Change-Post: 0 %
FEV6FVC-%PRED-PRE: 103 %
FEV6FVC-%Pred-Post: 103 %
FVC-%Change-Post: 3 %
FVC-%PRED-POST: 101 %
FVC-%PRED-PRE: 97 %
FVC-POST: 5.26 L
FVC-PRE: 5.07 L
POST FEV1/FVC RATIO: 77 %
POST FEV6/FVC RATIO: 100 %
PRE FEV1/FVC RATIO: 76 %
Pre FEV6/FVC Ratio: 99 %
RV % pred: 89 %
RV: 2.14 L
TLC % pred: 95 %
TLC: 7.19 L

## 2015-04-09 LAB — BLOOD GAS, ARTERIAL
ACID-BASE EXCESS: 0 mmol/L (ref 0.0–2.0)
Bicarbonate: 23.3 mEq/L (ref 20.0–24.0)
Drawn by: 27052
FIO2: 0.21
O2 Saturation: 93.8 %
PATIENT TEMPERATURE: 37
PCO2 ART: 35.5 mmHg (ref 35.0–45.0)
PH ART: 7.432 (ref 7.350–7.450)
PO2 ART: 70.5 mmHg — AB (ref 80.0–100.0)
TCO2: 20 mmol/L (ref 0–100)

## 2015-04-09 MED ORDER — ALBUTEROL SULFATE (2.5 MG/3ML) 0.083% IN NEBU
2.5000 mg | INHALATION_SOLUTION | Freq: Once | RESPIRATORY_TRACT | Status: AC
  Administered 2015-04-09: 2.5 mg via RESPIRATORY_TRACT

## 2015-04-09 NOTE — Progress Notes (Signed)
Subjective:    Patient ID: Steve Schwartz, male    DOB: 10/26/1955, 59 y.o.   MRN: [id number]  HPI Evaluation for dyspnea.  Steve Schwartz 59 year old with history of allergies. He presents with dyspnea for 2-3 years. This has been progressive in nature. He gets short of breath during exercise.This is associated occasional wheeze and chest congestion.  He used to do 100 sit-ups in the past but now can manage only about 50. He does have recurrent bronchitis. He suffers from allergic rhinitis, seasonal allergies. He is allergic to house dust, trees, pollen. His breathing usually worse during winter. He has gained about 10 pounds over a period of 2-3 years. His weight gain corresponds to the onset of his dyspnea. He has been prescribed albuterol but has not started taking it yet.  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 He most mostly has a desk job but he is exposed occasionally in his place of work to Mining engineerpaints, varnishes, chemical fumes. He is a never smoker but had been exposed to secondhand smoke from his father when he was young. He occasionally drinks. No illegal drug use.  Family history: Father-emphysema, myelodysplastic syndrome. Mother-uterine cancer  Data: Echo (01/09/15) Normal LV function; grade 1 diastolic dysfunction; mildly dilated aortic root and ascending aorta; trace TR.  CXR (03/01/15) No active cardiopulmonary disease.  PFTs 04/09/15 FVC 5.07 [97%) FEV1 2.83 [97%) F/F 76 TLC 7.50 [95%) DLCO 83%. Normal pulmonary function. No bronchodilator response.  Past Medical History  Diagnosis Date  . Hay fever   . Chicken pox   . Mumps   . Measles   . AR (allergic rhinitis)   . H/O vitamin D deficiency   . Allergy     dust, mold    Current outpatient prescriptions:  .  albuterol (PROVENTIL HFA;VENTOLIN HFA) 108 (90 BASE) MCG/ACT inhaler, Inhale 2 puffs into the lungs every 6 (six) hours as needed for wheezing or shortness of  breath., Disp: 1 Inhaler, Rfl: 2 .  chlorpheniramine (CHLOR-TRIMETON) 4 MG tablet, Take 4 mg by mouth as needed for allergies., Disp: , Rfl:  .  ibuprofen (ADVIL,MOTRIN) 200 MG tablet, Take 200 mg by mouth as needed for pain., Disp: , Rfl:  .  Multiple Vitamin (MULTIVITAMIN) tablet, Take 1 tablet by mouth daily., Disp: , Rfl:  .  NON FORMULARY, Take 1 tablet by mouth daily. [employer] ProstaCare Supplement., Disp: , Rfl:  .  NON FORMULARY, Take 1 tablet by mouth daily. New Chapter: Prostate 5LX Supplement, Disp: , Rfl:  .  OVER THE COUNTER MEDICATION, Take 1 tablet by mouth daily. VSC- by [employer], Disp: , Rfl:  .  Pseudoeph-Doxylamine-DM-APAP (NYQUIL MULTI-SYMPTOM PO), Take 1 tablet by mouth at bedtime as needed (allergies/sleep). , Disp: , Rfl:  .  pseudoephedrine (SUDAFED) 30 MG tablet, Take 30 mg by mouth every 4 (four) hours as needed for congestion (for Flying)., Disp: , Rfl:  .  vitamin C (ASCORBIC ACID) 500 MG tablet, Take 500 mg by mouth daily., Disp: , Rfl:  .  rosuvastatin (CRESTOR) 10 MG tablet, Take 1 tablet (10 mg total) by mouth daily. (Patient not taking: Reported on 04/09/2015), Disp: 30 tablet, Rfl: 5  Review of Systems Dyspnea on exertion, cough, no sputum production, wheezing, hemoptysis. No chest pain, palpitations. No nausea, vomiting, diarrhea, consultation. No fevers, chills, loss of weight, appetite, malaise, fatigue. All other review of systems are negative.    Objective:   Physical Exam  Blood pressure  132/70, pulse 77, temperature 98.8 F (37.1 C), temperature source Oral, height 6' (1.829 m), weight 221 lb 3.2 oz (100.336 kg), SpO2 96 %.  Gen: No apparent distress Neuro: No gross focal deficits. Neck: No JVD, lymphadenopathy, thyromegaly. RS: Bilateral expiratory wheeze, no crackles CVS: S1-S2 heard, no murmurs rubs gallops. Abdomen: Soft, positive bowel sounds. Extremities: No edema.    Assessment & Plan:  Dyspnea on exertion  May be secondary  to asthma/reactive airway disease even though his pulmonary function tests are normal with no bronchodilator response. He does have symptoms of allergies and is wheezing in the office today. I'll start him on Symbicort as a controller medication and asked him to use albuterol as needed and also before he exerts himself.  He has gained some weight and his dyspnea may be related in part to his deconditioning and weight gain. I have asked him to get on an exercise program and lose some weight with diet. She continues to be symptomatic then we can consider a cardiopulmonary exercise test.  Plan: - Start Symbicort, continue albuterol when necessary. - Weight loss.  Refused flu vaccination. Return to clinic in 3 months.  Chilton Greathouse MD [facility] Pager [phone] If no answer or after 3pm call: [phone] 04/09/2015, 5:36 PM

## 2015-04-09 NOTE — Patient Instructions (Signed)
We will start you on Symbicort inhaler. Use albuterol as needed, especially before exercise.  Advised to lose weight.  Return to clinic in 3 months

## 2015-04-11 ENCOUNTER — Encounter: Payer: Self-pay | Admitting: Physician Assistant

## 2015-04-14 ENCOUNTER — Encounter: Payer: Self-pay | Admitting: Pulmonary Disease

## 2015-04-16 MED ORDER — BUDESONIDE-FORMOTEROL FUMARATE 160-4.5 MCG/ACT IN AERO: 2.0000 | INHALATION_SPRAY | Freq: Two times a day (BID) | RESPIRATORY_TRACT | Status: DC

## 2015-04-16 NOTE — Telephone Encounter (Signed)
Apologies, this was supposed to have been taken care of at the visit. Symbicort 160 #3 sent to pharmacy.

## 2015-04-16 NOTE — Telephone Encounter (Signed)
Patient has not received medication through pharmacy.  Patient was suppose to start on Symbicort, but was not sent to pharmacy.  Dr. Isaiah SergeMannam, please advise what dose you want patient to start on for Symbicort, not in your notes.  Thanks.

## 2015-04-19 ENCOUNTER — Telehealth: Payer: Self-pay | Admitting: Physician Assistant

## 2015-04-19 NOTE — Telephone Encounter (Signed)
There is no vaccination. There is a screening for Hep C which is recommended by the CDC. I do personally highly recommend it but what I will say is we are having an issue with insurances not covering the testing. I would recommend he call his insurance representative to see if they will cover a Hep C antibody for routine screening.

## 2015-04-19 NOTE — Telephone Encounter (Signed)
Relation to UJ:WJXBpt:self Call back number:[phone]   Reason for call:  Patient inquiring about Hep C vaccination due to a friendly reminder from MyChart due to the patient being born in a "baby boomer year". Patient would like to know how PA feels about it. Please advise

## 2015-04-20 ENCOUNTER — Encounter: Payer: Self-pay | Admitting: Physician Assistant

## 2015-04-20 NOTE — Telephone Encounter (Signed)
Called patient regarding Hep C screening. States he will call his insurance to see if covered.

## 2015-04-24 ENCOUNTER — Encounter: Payer: Self-pay | Admitting: Physician Assistant

## 2015-04-25 ENCOUNTER — Encounter: Payer: Self-pay | Admitting: Physician Assistant

## 2015-04-25 ENCOUNTER — Telehealth: Payer: Self-pay | Admitting: *Deleted

## 2015-04-25 DIAGNOSIS — Z1159 Encounter for screening for other viral diseases: Secondary | ICD-10-CM

## 2015-04-25 NOTE — Telephone Encounter (Addendum)
Called and spoke with the pt and informed him that I read his MyChart message and informed him that we do not file the diagnosis. We enter in a code.   Informed him that I do not know if his insurance will consider preventive the same as routine screening or need for hep c screening.  Pt stated that he will contact his insurance again and ask.  Pt asked if I would give him the code for the hep c screening. Gave pt code:Z11.59-Need for hepatitis c screening test.  Pt asked if he could just come in and have the lab done.  Informed him that he will need to be scheduled a lab appt and we will need to place the order for the hep c.  Asked the pt to please give me a call back if he decides to come for the labs.  So I can place the order and schedule him a lab appt.  Pt agreed.//AB/CMA

## 2015-04-25 NOTE — Telephone Encounter (Signed)
Pt called and stated that his insurance will pay for the Hepatitis C with the code (Z11.59).  Pt was scheduled a lab appt for (Thurs-04/26/15 @ 8:45am).  Future lab ordered and sent.//AB/CMA

## 2015-04-26 ENCOUNTER — Other Ambulatory Visit (INDEPENDENT_AMBULATORY_CARE_PROVIDER_SITE_OTHER): Payer: BLUE CROSS/BLUE SHIELD

## 2015-04-26 DIAGNOSIS — Z1159 Encounter for screening for other viral diseases: Secondary | ICD-10-CM | POA: Diagnosis not present

## 2015-04-27 LAB — HEPATITIS C ANTIBODY: HCV AB: NEGATIVE

## 2015-04-28 ENCOUNTER — Encounter: Payer: Self-pay | Admitting: Physician Assistant

## 2015-06-04 ENCOUNTER — Encounter: Payer: Self-pay | Admitting: Physician Assistant

## 2015-06-04 ENCOUNTER — Encounter: Payer: Self-pay | Admitting: Cardiovascular Disease

## 2015-06-04 ENCOUNTER — Telehealth: Payer: Self-pay | Admitting: Cardiovascular Disease

## 2015-06-04 NOTE — Telephone Encounter (Signed)
New message   Pt wants to have his appt date pushed out 3 months he stated that the Dr. Eden Emms put him on   medication Crestor and that he needs to have his lab work checked in the 3 month period but he states that he has not started the medication yet.  Can pt have this appt pushed out until May?   Please leave him a vm and note if it is okay for schedule to schedule out that far

## 2015-06-04 NOTE — Telephone Encounter (Signed)
Left message for patient to call back. Patient's PCP is following patient's labs and Crestor medication. At patient's last OV with Dr. Eden Emms, he wanted patient to follow-up in 3 months.

## 2015-06-04 NOTE — Telephone Encounter (Signed)
Sent patient an email response to this message.

## 2015-06-07 ENCOUNTER — Ambulatory Visit: Payer: BLUE CROSS/BLUE SHIELD | Admitting: Cardiovascular Disease

## 2015-06-19 ENCOUNTER — Telehealth: Payer: Self-pay | Admitting: Physician Assistant

## 2015-06-19 NOTE — Telephone Encounter (Signed)
LM for pt to call and schedule flu shot or update records. °

## 2015-06-20 ENCOUNTER — Encounter: Payer: Self-pay | Admitting: Physician Assistant

## 2015-07-12 ENCOUNTER — Ambulatory Visit: Payer: BLUE CROSS/BLUE SHIELD | Admitting: Pulmonary Disease

## 2015-08-23 ENCOUNTER — Encounter: Payer: Self-pay | Admitting: Physician Assistant

## 2015-10-05 ENCOUNTER — Encounter: Payer: Self-pay | Admitting: Physician Assistant

## 2015-10-24 ENCOUNTER — Ambulatory Visit: Payer: BLUE CROSS/BLUE SHIELD | Admitting: Pulmonary Disease

## 2015-11-07 ENCOUNTER — Encounter: Payer: Self-pay | Admitting: Physician Assistant

## 2015-12-26 ENCOUNTER — Ambulatory Visit (INDEPENDENT_AMBULATORY_CARE_PROVIDER_SITE_OTHER): Payer: BLUE CROSS/BLUE SHIELD | Admitting: Physician Assistant

## 2015-12-26 ENCOUNTER — Encounter: Payer: Self-pay | Admitting: Physician Assistant

## 2015-12-26 VITALS — HR 81 | Temp 98.3°F | Ht 73.0 in | Wt 213.0 lb

## 2015-12-26 DIAGNOSIS — R5383 Other fatigue: Secondary | ICD-10-CM | POA: Diagnosis not present

## 2015-12-26 DIAGNOSIS — Z125 Encounter for screening for malignant neoplasm of prostate: Secondary | ICD-10-CM | POA: Diagnosis not present

## 2015-12-26 DIAGNOSIS — Z0000 Encounter for general adult medical examination without abnormal findings: Secondary | ICD-10-CM

## 2015-12-26 LAB — URINALYSIS, ROUTINE W REFLEX MICROSCOPIC
Bilirubin Urine: NEGATIVE
HGB URINE DIPSTICK: NEGATIVE
Ketones, ur: NEGATIVE
LEUKOCYTES UA: NEGATIVE
NITRITE: NEGATIVE
PH: 7 (ref 5.0–8.0)
RBC / HPF: NONE SEEN (ref 0–?)
SPECIFIC GRAVITY, URINE: 1.01 (ref 1.000–1.030)
TOTAL PROTEIN, URINE-UPE24: NEGATIVE
URINE GLUCOSE: NEGATIVE
UROBILINOGEN UA: 0.2 (ref 0.0–1.0)
WBC, UA: NONE SEEN (ref 0–?)

## 2015-12-26 LAB — HEMOGLOBIN A1C: HEMOGLOBIN A1C: 5.5 % (ref 4.6–6.5)

## 2015-12-26 LAB — LIPID PANEL
CHOLESTEROL: 211 mg/dL — AB (ref 0–200)
HDL: 30.8 mg/dL — ABNORMAL LOW (ref >39.00)
LDL Cholesterol: 158 mg/dL — ABNORMAL HIGH (ref 0–99)
NONHDL: 180.11
Total CHOL/HDL Ratio: 7
Triglycerides: 110 mg/dL (ref 0.0–149.0)
VLDL: 22 mg/dL (ref 0.0–40.0)

## 2015-12-26 LAB — COMPREHENSIVE METABOLIC PANEL
ALBUMIN: 4.5 g/dL (ref 3.5–5.2)
ALT: 24 U/L (ref 0–53)
AST: 19 U/L (ref 0–37)
Alkaline Phosphatase: 74 U/L (ref 39–117)
BILIRUBIN TOTAL: 0.6 mg/dL (ref 0.2–1.2)
BUN: 12 mg/dL (ref 6–23)
CALCIUM: 8.9 mg/dL (ref 8.4–10.5)
CHLORIDE: 104 meq/L (ref 96–112)
CO2: 27 mEq/L (ref 19–32)
CREATININE: 0.78 mg/dL (ref 0.40–1.50)
GFR: 107.44 mL/min (ref >60.00)
Glucose, Bld: 96 mg/dL (ref 70–99)
Potassium: 4.1 mEq/L (ref 3.5–5.1)
Sodium: 137 mEq/L (ref 135–145)
Total Protein: 6.9 g/dL (ref 6.0–8.3)

## 2015-12-26 LAB — CBC
HCT: 43.3 % (ref 39.0–52.0)
Hemoglobin: 14.7 g/dL (ref 13.0–17.0)
MCHC: 34 g/dL (ref 30.0–36.0)
MCV: 82.5 fl (ref 78.0–100.0)
PLATELETS: 241 10*3/uL (ref 150.0–400.0)
RBC: 5.25 Mil/uL (ref 4.22–5.81)
RDW: 13.2 % (ref 11.5–15.5)
WBC: 5.6 10*3/uL (ref 4.0–10.5)

## 2015-12-26 LAB — PSA: PSA: 0.89 ng/mL (ref 0.10–4.00)

## 2015-12-26 LAB — TSH: TSH: 3.24 u[IU]/mL (ref 0.35–4.50)

## 2015-12-26 LAB — TESTOSTERONE: Testosterone: 347.61 ng/dL (ref 300.00–890.00)

## 2015-12-26 NOTE — Assessment & Plan Note (Signed)
The natural history of prostate cancer and ongoing controversy regarding screening and potential treatment outcomes of prostate cancer has been discussed with the patient. The meaning of a false positive PSA and a false negative PSA has been discussed. He indicates understanding of the limitations of this screening test and wishes  to proceed with screening PSA testing.  

## 2015-12-26 NOTE — Patient Instructions (Signed)
Please go to the lab for blood work.   Our office will call you with your results unless you have chosen to receive results via MyChart.  If your blood work is normal we will follow-up each year for physicals and as scheduled for chronic medical problems.  If anything is abnormal we will treat accordingly and get you in for a follow-up.  Stay active. I am impressed with your diet changes and weight loss.  Make sure to take your multivitamin daily.  Preventive Care for Adults, Male A healthy lifestyle and preventive care can promote health and wellness. Preventive health guidelines for men include the following key practices:  A routine yearly physical is a good way to check with your health care provider about your health and preventative screening. It is a chance to share any concerns and updates on your health and to receive a thorough exam.  Visit your dentist for a routine exam and preventative care every 6 months. Brush your teeth twice a day and floss once a day. Good oral hygiene prevents tooth decay and gum disease.  The frequency of eye exams is based on your age, health, family medical history, use of contact lenses, and other factors. Follow your health care provider's recommendations for frequency of eye exams.  Eat a healthy diet. Foods such as vegetables, fruits, whole grains, low-fat dairy products, and lean protein foods contain the nutrients you need without too many calories. Decrease your intake of foods high in solid fats, added sugars, and salt. Eat the right amount of calories for you.Get information about a proper diet from your health care provider, if necessary.  Regular physical exercise is one of the most important things you can do for your health. Most adults should get at least 150 minutes of moderate-intensity exercise (any activity that increases your heart rate and causes you to sweat) each week. In addition, most adults need muscle-strengthening exercises on 2  or more days a week.  Maintain a healthy weight. The body mass index (BMI) is a screening tool to identify possible weight problems. It provides an estimate of body fat based on height and weight. Your health care provider can find your BMI and can help you achieve or maintain a healthy weight.For adults 20 years and older:  A BMI below 18.5 is considered underweight.  A BMI of 18.5 to 24.9 is normal.  A BMI of 25 to 29.9 is considered overweight.  A BMI of 30 and above is considered obese.  Maintain normal blood lipids and cholesterol levels by exercising and minimizing your intake of saturated fat. Eat a balanced diet with plenty of fruit and vegetables. Blood tests for lipids and cholesterol should begin at age 49 and be repeated every 5 years. If your lipid or cholesterol levels are high, you are over 50, or you are at high risk for heart disease, you may need your cholesterol levels checked more frequently.Ongoing high lipid and cholesterol levels should be treated with medicines if diet and exercise are not working.  If you smoke, find out from your health care provider how to quit. If you do not use tobacco, do not start.  Lung cancer screening is recommended for adults aged 29-80 years who are at high risk for developing lung cancer because of a history of smoking. A yearly low-dose CT scan of the lungs is recommended for people who have at least a 30-pack-year history of smoking and are a current smoker or have quit within the  past 15 years. A pack year of smoking is smoking an average of 1 pack of cigarettes a day for 1 year (for example: 1 pack a day for 30 years or 2 packs a day for 15 years). Yearly screening should continue until the smoker has stopped smoking for at least 15 years. Yearly screening should be stopped for people who develop a health problem that would prevent them from having lung cancer treatment.  If you choose to drink alcohol, do not have more than 2 drinks per  day. One drink is considered to be 12 ounces (355 mL) of beer, 5 ounces (148 mL) of wine, or 1.5 ounces (44 mL) of liquor.  Avoid use of street drugs. Do not share needles with anyone. Ask for help if you need support or instructions about stopping the use of drugs.  High blood pressure causes heart disease and increases the risk of stroke. Your blood pressure should be checked at least every 1-2 years. Ongoing high blood pressure should be treated with medicines, if weight loss and exercise are not effective.  If you are 66-90 years old, ask your health care provider if you should take aspirin to prevent heart disease.  Diabetes screening is done by taking a blood sample to check your blood glucose level after you have not eaten for a certain period of time (fasting). If you are not overweight and you do not have risk factors for diabetes, you should be screened once every 3 years starting at age 58. If you are overweight or obese and you are 53-2 years of age, you should be screened for diabetes every year as part of your cardiovascular risk assessment.  Colorectal cancer can be detected and often prevented. Most routine colorectal cancer screening begins at the age of 50 and continues through age 30. However, your health care provider may recommend screening at an earlier age if you have risk factors for colon cancer. On a yearly basis, your health care provider may provide home test kits to check for hidden blood in the stool. Use of a small camera at the end of a tube to directly examine the colon (sigmoidoscopy or colonoscopy) can detect the earliest forms of colorectal cancer. Talk to your health care provider about this at age 66, when routine screening begins. Direct exam of the colon should be repeated every 5-10 years through age 70, unless early forms of precancerous polyps or small growths are found.  People who are at an increased risk for hepatitis B should be screened for this virus. You  are considered at high risk for hepatitis B if:  You were born in a country where hepatitis B occurs often. Talk with your health care provider about which countries are considered high risk.  Your parents were born in a high-risk country and you have not received a shot to protect against hepatitis B (hepatitis B vaccine).  You have HIV or AIDS.  You use needles to inject street drugs.  You live with, or have sex with, someone who has hepatitis B.  You are a man who has sex with other men (MSM).  You get hemodialysis treatment.  You take certain medicines for conditions such as cancer, organ transplantation, and autoimmune conditions.  Hepatitis C blood testing is recommended for all people born from [birth year] through 1965 and any individual with known risks for hepatitis C.  Practice safe sex. Use condoms and avoid high-risk sexual practices to reduce the spread of sexually transmitted infections (  STIs). STIs include gonorrhea, chlamydia, syphilis, trichomonas, herpes, HPV, and human immunodeficiency virus (HIV). Herpes, HIV, and HPV are viral illnesses that have no cure. They can result in disability, cancer, and death.  If you are a man who has sex with other men, you should be screened at least once per year for:  HIV.  Urethral, rectal, and pharyngeal infection of gonorrhea, chlamydia, or both.  If you are at risk of being infected with HIV, it is recommended that you take a prescription medicine daily to prevent HIV infection. This is called preexposure prophylaxis (PrEP). You are considered at risk if:  You are a man who has sex with other men (MSM) and have other risk factors.  You are a heterosexual man, are sexually active, and are at increased risk for HIV infection.  You take drugs by injection.  You are sexually active with a partner who has HIV.  Talk with your health care provider about whether you are at high risk of being infected with HIV. If you choose to begin  PrEP, you should first be tested for HIV. You should then be tested every 3 months for as long as you are taking PrEP.  A one-time screening for abdominal aortic aneurysm (AAA) and surgical repair of large AAAs by ultrasound are recommended for men ages 98 to 5 years who are current or former smokers.  Healthy men should no longer receive prostate-specific antigen (PSA) blood tests as part of routine cancer screening. Talk with your health care provider about prostate cancer screening.  Testicular cancer screening is not recommended for adult males who have no symptoms. Screening includes self-exam, a health care provider exam, and other screening tests. Consult with your health care provider about any symptoms you have or any concerns you have about testicular cancer.  Use sunscreen. Apply sunscreen liberally and repeatedly throughout the day. You should seek shade when your shadow is shorter than you. Protect yourself by wearing long sleeves, pants, a wide-brimmed hat, and sunglasses year round, whenever you are outdoors.  Once a month, do a whole-body skin exam, using a mirror to look at the skin on your back. Tell your health care provider about new moles, moles that have irregular borders, moles that are larger than a pencil eraser, or moles that have changed in shape or color.  Stay current with required vaccines (immunizations).  Influenza vaccine. All adults should be immunized every year.  Tetanus, diphtheria, and acellular pertussis (Td, Tdap) vaccine. An adult who has not previously received Tdap or who does not know his vaccine status should receive 1 dose of Tdap. This initial dose should be followed by tetanus and diphtheria toxoids (Td) booster doses every 10 years. Adults with an unknown or incomplete history of completing a 3-dose immunization series with Td-containing vaccines should begin or complete a primary immunization series including a Tdap dose. Adults should receive a Td  booster every 10 years.  Varicella vaccine. An adult without evidence of immunity to varicella should receive 2 doses or a second dose if he has previously received 1 dose.  Human papillomavirus (HPV) vaccine. Males aged 11-21 years who have not received the vaccine previously should receive the 3-dose series. Males aged 22-26 years may be immunized. Immunization is recommended through the age of 1 years for any male who has sex with males and did not get any or all doses earlier. Immunization is recommended for any person with an immunocompromised condition through the age of 49 years if  he did not get any or all doses earlier. During the 3-dose series, the second dose should be obtained 4-8 weeks after the first dose. The third dose should be obtained 24 weeks after the first dose and 16 weeks after the second dose.  Zoster vaccine. One dose is recommended for adults aged 28 years or older unless certain conditions are present.  Measles, mumps, and rubella (MMR) vaccine. Adults born before 45 generally are considered immune to measles and mumps. Adults born in [birth year] or later should have 1 or more doses of MMR vaccine unless there is a contraindication to the vaccine or there is laboratory evidence of immunity to each of the three diseases. A routine second dose of MMR vaccine should be obtained at least 28 days after the first dose for students attending postsecondary schools, health care workers, or international travelers. People who received inactivated measles vaccine or an unknown type of measles vaccine during 1963-1967 should receive 2 doses of MMR vaccine. People who received inactivated mumps vaccine or an unknown type of mumps vaccine before 1979 and are at high risk for mumps infection should consider immunization with 2 doses of MMR vaccine. Unvaccinated health care workers born before 4 who lack laboratory evidence of measles, mumps, or rubella immunity or laboratory confirmation of  disease should consider measles and mumps immunization with 2 doses of MMR vaccine or rubella immunization with 1 dose of MMR vaccine.  Pneumococcal 13-valent conjugate (PCV13) vaccine. When indicated, a person who is uncertain of his immunization history and has no record of immunization should receive the PCV13 vaccine. All adults 53 years of age and older should receive this vaccine. An adult aged 72 years or older who has certain medical conditions and has not been previously immunized should receive 1 dose of PCV13 vaccine. This PCV13 should be followed with a dose of pneumococcal polysaccharide (PPSV23) vaccine. Adults who are at high risk for pneumococcal disease should obtain the PPSV23 vaccine at least 8 weeks after the dose of PCV13 vaccine. Adults older than 61 years of age who have normal immune system function should obtain the PPSV23 vaccine dose at least 1 year after the dose of PCV13 vaccine.  Pneumococcal polysaccharide (PPSV23) vaccine. When PCV13 is also indicated, PCV13 should be obtained first. All adults aged 25 years and older should be immunized. An adult younger than age 55 years who has certain medical conditions should be immunized. Any person who resides in a nursing home or long-term care facility should be immunized. An adult smoker should be immunized. People with an immunocompromised condition and certain other conditions should receive both PCV13 and PPSV23 vaccines. People with human immunodeficiency virus (HIV) infection should be immunized as soon as possible after diagnosis. Immunization during chemotherapy or radiation therapy should be avoided. Routine use of PPSV23 vaccine is not recommended for American Indians, [place] Natives, or people younger than 65 years unless there are medical conditions that require PPSV23 vaccine. When indicated, people who have unknown immunization and have no record of immunization should receive PPSV23 vaccine. One-time revaccination 5 years  after the first dose of PPSV23 is recommended for people aged 19-64 years who have chronic kidney failure, nephrotic syndrome, asplenia, or immunocompromised conditions. People who received 1-2 doses of PPSV23 before age 40 years should receive another dose of PPSV23 vaccine at age 87 years or later if at least 5 years have passed since the previous dose. Doses of PPSV23 are not needed for people immunized with PPSV23 at or  after age 38 years.  Meningococcal vaccine. Adults with asplenia or persistent complement component deficiencies should receive 2 doses of quadrivalent meningococcal conjugate (MenACWY-D) vaccine. The doses should be obtained at least 2 months apart. Microbiologists working with certain meningococcal bacteria, [place] recruits, people at risk during an outbreak, and people who travel to or live in countries with a high rate of meningitis should be immunized. A first-year college student up through age 69 years who is living in a residence hall should receive a dose if he did not receive a dose on or after his 16th birthday. Adults who have certain high-risk conditions should receive one or more doses of vaccine.  Hepatitis A vaccine. Adults who wish to be protected from this disease, have chronic liver disease, work with hepatitis A-infected animals, work in hepatitis A research labs, or travel to or work in countries with a high rate of hepatitis A should be immunized. Adults who were previously unvaccinated and who anticipate close contact with an international adoptee during the first 60 days after arrival in the [place] from a country with a high rate of hepatitis A should be immunized.  Hepatitis B vaccine. Adults should be immunized if they wish to be protected from this disease, are under age 44 years and have diabetes, have chronic liver disease, have had more than one sex partner in the past 6 months, may be exposed to blood or other infectious body fluids, are household  contacts or sex partners of hepatitis B positive people, are clients or workers in certain care facilities, or travel to or work in countries with a high rate of hepatitis B.  Haemophilus influenzae type b (Hib) vaccine. A previously unvaccinated person with asplenia or sickle cell disease or having a scheduled splenectomy should receive 1 dose of Hib vaccine. Regardless of previous immunization, a recipient of a hematopoietic stem cell transplant should receive a 3-dose series 6-12 months after his successful transplant. Hib vaccine is not recommended for adults with HIV infection. Preventive Service / Frequency Ages 49 to 69  Blood pressure check.** / Every 3-5 years.  Lipid and cholesterol check.** / Every 5 years beginning at age 36.  Hepatitis C blood test.** / For any individual with known risks for hepatitis C.  Skin self-exam. / Monthly.  Influenza vaccine. / Every year.  Tetanus, diphtheria, and acellular pertussis (Tdap, Td) vaccine.** / Consult your health care provider. 1 dose of Td every 10 years.  Varicella vaccine.** / Consult your health care provider.  HPV vaccine. / 3 doses over 6 months, if 36 or younger.  Measles, mumps, rubella (MMR) vaccine.** / You need at least 1 dose of MMR if you were born in 1957 or later. You may also need a second dose.  Pneumococcal 13-valent conjugate (PCV13) vaccine.** / Consult your health care provider.  Pneumococcal polysaccharide (PPSV23) vaccine.** / 1 to 2 doses if you smoke cigarettes or if you have certain conditions.  Meningococcal vaccine.** / 1 dose if you are age 7 to 67 years and a Market researcher living in a residence hall, or have one of several medical conditions. You may also need additional booster doses.  Hepatitis A vaccine.** / Consult your health care provider.  Hepatitis B vaccine.** / Consult your health care provider.  Haemophilus influenzae type b (Hib) vaccine.** / Consult your health care  provider. Ages 87 to 58  Blood pressure check.** / Every year.  Lipid and cholesterol check.** / Every 5 years beginning at age  20.  Lung cancer screening. / Every year if you are aged 4-80 years and have a 30-pack-year history of smoking and currently smoke or have quit within the past 15 years. Yearly screening is stopped once you have quit smoking for at least 15 years or develop a health problem that would prevent you from having lung cancer treatment.  Fecal occult blood test (FOBT) of stool. / Every year beginning at age 80 and continuing until age 71. You may not have to do this test if you get a colonoscopy every 10 years.  Flexible sigmoidoscopy** or colonoscopy.** / Every 5 years for a flexible sigmoidoscopy or every 10 years for a colonoscopy beginning at age 17 and continuing until age 78.  Hepatitis C blood test.** / For all people born from [birth year] through 1965 and any individual with known risks for hepatitis C.  Skin self-exam. / Monthly.  Influenza vaccine. / Every year.  Tetanus, diphtheria, and acellular pertussis (Tdap/Td) vaccine.** / Consult your health care provider. 1 dose of Td every 10 years.  Varicella vaccine.** / Consult your health care provider.  Zoster vaccine.** / 1 dose for adults aged 19 years or older.  Measles, mumps, rubella (MMR) vaccine.** / You need at least 1 dose of MMR if you were born in 1957 or later. You may also need a second dose.  Pneumococcal 13-valent conjugate (PCV13) vaccine.** / Consult your health care provider.  Pneumococcal polysaccharide (PPSV23) vaccine.** / 1 to 2 doses if you smoke cigarettes or if you have certain conditions.  Meningococcal vaccine.** / Consult your health care provider.  Hepatitis A vaccine.** / Consult your health care provider.  Hepatitis B vaccine.** / Consult your health care provider.  Haemophilus influenzae type b (Hib) vaccine.** / Consult your health care provider. Ages 72 and over  Blood  pressure check.** / Every year.  Lipid and cholesterol check.**/ Every 5 years beginning at age 64.  Lung cancer screening. / Every year if you are aged 57-80 years and have a 30-pack-year history of smoking and currently smoke or have quit within the past 15 years. Yearly screening is stopped once you have quit smoking for at least 15 years or develop a health problem that would prevent you from having lung cancer treatment.  Fecal occult blood test (FOBT) of stool. / Every year beginning at age 27 and continuing until age 66. You may not have to do this test if you get a colonoscopy every 10 years.  Flexible sigmoidoscopy** or colonoscopy.** / Every 5 years for a flexible sigmoidoscopy or every 10 years for a colonoscopy beginning at age 39 and continuing until age 28.  Hepatitis C blood test.** / For all people born from [birth year] through 1966 and any individual with known risks for hepatitis C.  Abdominal aortic aneurysm (AAA) screening.** / A one-time screening for ages 41 to 16 years who are current or former smokers.  Skin self-exam. / Monthly.  Influenza vaccine. / Every year.  Tetanus, diphtheria, and acellular pertussis (Tdap/Td) vaccine.** / 1 dose of Td every 10 years.  Varicella vaccine.** / Consult your health care provider.  Zoster vaccine.** / 1 dose for adults aged 31 years or older.  Pneumococcal 13-valent conjugate (PCV13) vaccine.** / 1 dose for all adults aged 42 years and older.  Pneumococcal polysaccharide (PPSV23) vaccine.** / 1 dose for all adults aged 20 years and older.  Meningococcal vaccine.** / Consult your health care provider.  Hepatitis A vaccine.** / Consult your health care provider.  Hepatitis B vaccine.** / Consult your health care provider.  Haemophilus influenzae type b (Hib) vaccine.** / Consult your health care provider. **Family history and personal history of risk and conditions may change your health care provider's recommendations.   This  information is not intended to replace advice given to you by your health care provider. Make sure you discuss any questions you have with your health care provider.   Document Released: 06/10/2001 Document Revised: 05/05/2014 Document Reviewed: 09/09/2010 Elsevier Interactive Patient Education [employer].

## 2015-12-26 NOTE — Assessment & Plan Note (Signed)
Will check lab panel to include TSH and testosterone levels today.

## 2015-12-26 NOTE — Assessment & Plan Note (Signed)
Depression screen negative. Health Maintenance reviewed -- declines flu shot. Colonoscopy up-to-date Preventive schedule discussed and handout given in AVS. Will obtain fasting labs today.

## 2015-12-26 NOTE — Progress Notes (Signed)
Patient presents to clinic today for annual exam.  Patient is fasting for labs.  Acute Concerns: Patient notes fatigue, becoming more prevalent towards the end of each work week. Denies increased stress or anxiety. Denies change in diet, exercise or sleep. Denies history of thyroid disorder but has large family history of hypothyroidism. Would like assessed today.  Chronic Issues: Hyperlipidemia -- Is currently on a regimen of Crestor 10 mg daily. Is not taking as directed. Has been working on diet and exercise. Patient endorses 10 pound weight loss. Body mass index is 28.1 kg/m.  Health Maintenance: Immunizations -- Tetanus and Shingles vaccines are up-to-date. Declines flu shot.  Colonoscopy -- up-to-date. Due for repeat next year.  Past Medical History:  Diagnosis Date  . Allergy    dust, mold  . AR (allergic rhinitis)   . Chicken pox   . H/O vitamin D deficiency   . Hay fever   . Measles   . Mumps     Past Surgical History:  Procedure Laterality Date  . ROOT CANAL  07/28/14  . TONSILLECTOMY AND ADENOIDECTOMY  1959  . WISDOM TOOTH EXTRACTION      Current Outpatient Prescriptions on File Prior to Visit  Medication Sig Dispense Refill  . albuterol (PROVENTIL HFA;VENTOLIN HFA) 108 (90 BASE) MCG/ACT inhaler Inhale 2 puffs into the lungs every 6 (six) hours as needed for wheezing or shortness of breath. 1 Inhaler 2  . budesonide-formoterol (SYMBICORT) 160-4.5 MCG/ACT inhaler Inhale 2 puffs into the lungs 2 (two) times daily. 3 Inhaler 1  . chlorpheniramine (CHLOR-TRIMETON) 4 MG tablet Take 4 mg by mouth as needed for allergies.    Marland Kitchen ibuprofen (ADVIL,MOTRIN) 200 MG tablet Take 200 mg by mouth as needed for pain.    . Multiple Vitamin (MULTIVITAMIN) tablet Take 1 tablet by mouth daily.    . NON FORMULARY Take 1 tablet by mouth daily. [employer]    . NON FORMULARY Take 1 tablet by mouth daily. New Chapter: Prostate 5LX Supplement    . OVER THE COUNTER  MEDICATION Take 1 tablet by mouth daily. VSC- by Nature's Sunshine    . pseudoephedrine (SUDAFED) 30 MG tablet Take 30 mg by mouth every 4 (four) hours as needed for congestion (for Flying).    . rosuvastatin (CRESTOR) 10 MG tablet Take 1 tablet (10 mg total) by mouth daily. (Patient not taking: Reported on 04/09/2015) 30 tablet 5  . vitamin C (ASCORBIC ACID) 500 MG tablet Take 500 mg by mouth daily.     No current facility-administered medications on file prior to visit.     No Known Allergies  Family History  Problem Relation Age of Onset  . Hypertension Mother   . Uterine cancer Mother   . Thyroid disease Mother   . Thyroid disease Sister     x2  . Emphysema Father   . Lung disease Father   . Other Father     Blood Disorder  . Myelodysplastic syndrome Father   . Stroke Paternal Grandmother   . Heart disease Paternal Grandfather   . Heart disease Maternal Grandfather   . Obesity Sister   . Healthy Daughter     x2  . Diabetes Other     Social History   Social History  . Marital status: Married    Spouse name: N/A  . Number of children: N/A  . Years of education: N/A   Occupational History  . Not on file.   Social History Main Topics  .  Smoking status: Never Smoker  . Smokeless tobacco: Never Used  . Alcohol use 0.0 oz/week     Comment: rare  . Drug use: No  . Sexual activity: Yes    Birth control/ protection: None   Other Topics Concern  . Not on file   Social History Narrative  . No narrative on file   Review of Systems  Constitutional: Negative for fever and weight loss.  HENT: Negative for ear discharge, ear pain, hearing loss and tinnitus.   Eyes: Negative for blurred vision, double vision, photophobia and pain.  Respiratory: Negative for cough and shortness of breath.   Cardiovascular: Negative for chest pain and palpitations.  Gastrointestinal: Negative for abdominal pain, blood in stool, constipation, diarrhea, heartburn, melena, nausea and  vomiting.  Genitourinary: Negative for dysuria, flank pain, frequency, hematuria and urgency.  Musculoskeletal: Negative for falls.  Neurological: Negative for dizziness, loss of consciousness and headaches.  Endo/Heme/Allergies: Negative for environmental allergies.  Psychiatric/Behavioral: Negative for depression, hallucinations, substance abuse and suicidal ideas. The patient is not nervous/anxious and does not have insomnia.    Pulse 81   Temp 98.3 F (36.8 C)   Ht 6\' 1"  (1.854 m)   Wt 213 lb (96.6 kg)   SpO2 97%   BMI 28.10 kg/m   Physical Exam  Constitutional: He is oriented to person, place, and time and well-developed, well-nourished, and in no distress.  HENT:  Head: Normocephalic and atraumatic.  Right Ear: External ear normal.  Left Ear: External ear normal.  Nose: Nose normal.  Mouth/Throat: Oropharynx is clear and moist. No oropharyngeal exudate.  Eyes: Conjunctivae and EOM are normal. Pupils are equal, round, and reactive to light.  Neck: Neck supple. No thyromegaly present.  Cardiovascular: Normal rate, regular rhythm, normal heart sounds and intact distal pulses.   Pulmonary/Chest: Effort normal and breath sounds normal. No respiratory distress. He has no wheezes. He has no rales. He exhibits no tenderness.  Abdominal: Soft. Bowel sounds are normal. He exhibits no distension and no mass. There is no tenderness. There is no rebound and no guarding.  Genitourinary: Testes/scrotum normal.  Lymphadenopathy:    He has no cervical adenopathy.  Neurological: He is alert and oriented to person, place, and time.  Skin: Skin is warm and dry. No rash noted.  Psychiatric: Affect normal.  Vitals reviewed.  Assessment/Plan: No problem-specific Assessment & Plan notes found for this encounter.    Piedad ClimesMartin, Aleza Pew Cody, PA-C

## 2015-12-28 ENCOUNTER — Encounter: Payer: Self-pay | Admitting: Physician Assistant

## 2015-12-29 LAB — VITAMIN D 1,25 DIHYDROXY
VITAMIN D3 1, 25 (OH): 41 pg/mL
Vitamin D 1, 25 (OH)2 Total: 41 pg/mL (ref 18–72)

## 2016-01-10 ENCOUNTER — Encounter: Payer: Self-pay | Admitting: Pulmonary Disease

## 2016-01-10 ENCOUNTER — Ambulatory Visit (INDEPENDENT_AMBULATORY_CARE_PROVIDER_SITE_OTHER): Payer: BLUE CROSS/BLUE SHIELD | Admitting: Pulmonary Disease

## 2016-01-10 DIAGNOSIS — R0600 Dyspnea, unspecified: Secondary | ICD-10-CM | POA: Diagnosis not present

## 2016-01-10 MED ORDER — BUDESONIDE-FORMOTEROL FUMARATE 80-4.5 MCG/ACT IN AERO: 2.0000 | INHALATION_SPRAY | Freq: Two times a day (BID) | RESPIRATORY_TRACT | 1 refills | Status: DC

## 2016-01-10 NOTE — Assessment & Plan Note (Signed)
Please start using your Singulair Inhaler 2 puffs twice daily.( Maintenance medication, each day without fail.) Please start using your Albuterol Inhaler 2 puffs up to every 6 hours as needed for shortness of breath or wheezing. ( Rescue inhaler. You use this with activities you know cause you shortness of breath.) We will give you a spacer to ease the use of the inhalers. We will instruct you on inhaler use. Remember to rinse you mouth after using your Symbicort. Take you antihistamine daily to control your post nasal drip. Sips of water instead of throat clearing. Avoid mint and menthol if possible. Try sugar free jolly ranchers for throat soothing. Follow up with Dr. Isaiah SergeMannam in 3 months after using your maintenance medication consistently.. Continue your weight loss efforts as this will also help with your shortness of breath/ deconditioning. Please let us know if you change your mind about a flu shot. Please contact office for sooner follow up if symptoms do not improve or worsen or seek emergency care

## 2016-01-10 NOTE — Progress Notes (Signed)
History of Present Illness Steve Schwartz is a 60 y.o. male never smoker with increasing dyspnea over the last 2-3 years , frequent bronchitis, seasonal rhinitis most likely multi-factorial / suspected asthma/ reactive airway disease followed by Dr. Isaiah SergeMannam.   01/10/2016 Follow Up OV: Pt. Presents to the office today stating he continues to have shortness of breath on exertion.. Secretions are clear. He is not using his Symbicort or his albuterol. We had a long discussion about medication compliance, and the difference between maintenance medications and rescue medications and the use of both. He will need further instruction on inhaler use today. We will offer a spacer to assist with inhale use.He states he does have post nasal drip and seasonal allergies.This is making him cough, and causing frequent throat clearing. He has not been consistently taking his anti-hiatamine. He denies fever, chest pain, orthopnea or hemoptysis.He does not want the flu shot today.He has not had the pneumococcal vaccine, and wants to talk with his PCP about it before getting it.  Data: Echo (01/09/15) Normal LV function; grade 1 diastolic dysfunction; mildly dilated aortic root and ascending aorta; trace TR.  CXR (03/01/15) No active cardiopulmonary disease.  PFTs 04/09/15 FVC 5.07 [97%) FEV1 2.83 [97%) F/F 76 TLC 7.50 [95%) DLCO 83%. Normal pulmonary function. No bronchodilator response.  Past medical hx Past Medical History:  Diagnosis Date  . Allergy    dust, mold  . AR (allergic rhinitis)   . Chicken pox   . H/O vitamin D deficiency   . Hay fever   . Measles   . Mumps      Past surgical hx, Family hx, Social hx all reviewed.  Current Outpatient Prescriptions on File Prior to Visit  Medication Sig  . budesonide-formoterol (SYMBICORT) 160-4.5 MCG/ACT inhaler Inhale 2 puffs into the lungs 2 (two) times daily.  . chlorpheniramine (CHLOR-TRIMETON) 4 MG tablet Take 4 mg by mouth as needed for  allergies.  Marland Kitchen. ibuprofen (ADVIL,MOTRIN) 200 MG tablet Take 200 mg by mouth at bedtime.   . Multiple Vitamin (MULTIVITAMIN) tablet Take 1 tablet by mouth daily.  Marland Kitchen. OVER THE COUNTER MEDICATION Take 1 tablet by mouth daily. VSC- by [employer] Sunshine  . pseudoephedrine (SUDAFED) 30 MG tablet Take 30 mg by mouth every 4 (four) hours as needed for congestion (for Flying).  . vitamin C (ASCORBIC ACID) 500 MG tablet Take 500 mg by mouth daily.  Marland Kitchen. albuterol (PROVENTIL HFA;VENTOLIN HFA) 108 (90 BASE) MCG/ACT inhaler Inhale 2 puffs into the lungs every 6 (six) hours as needed for wheezing or shortness of breath. (Patient not taking: Reported on 01/10/2016)  . NON FORMULARY Take 1 tablet by mouth daily. [employer] ProstaCare Supplement.  . NON FORMULARY Take 1 tablet by mouth daily. New Chapter: Prostate 5LX Supplement  . rosuvastatin (CRESTOR) 10 MG tablet Take 1 tablet (10 mg total) by mouth daily. (Patient not taking: Reported on 01/10/2016)   No current facility-administered medications on file prior to visit.      No Known Allergies  Review Of Systems:  Constitutional:   No  weight loss, night sweats,  Fevers, chills, fatigue, or  lassitude.  HEENT:   No headaches,  Difficulty swallowing,  Tooth/dental problems, or  Sore throat,                No sneezing, itching, ear ache, nasal congestion, +post nasal drip,   CV:  No chest pain,  Orthopnea, PND, swelling in lower extremities, anasarca, dizziness, palpitations, syncope.   GI  No heartburn, indigestion, abdominal pain, nausea, vomiting, diarrhea, change in bowel habits, loss of appetite, bloody stools.   Resp: + shortness of breath with exertion or at rest.  + excess mucus, + productive cough,  No non-productive cough,  No coughing up of blood.  No change in color of mucus.  + wheezing.  No chest wall deformity  Skin: no rash or lesions.  GU: no dysuria, change in color of urine, no urgency or frequency.  No flank pain, no hematuria   MS:  No  joint pain or swelling.  No decreased range of motion.  No back pain.  Psych:  No change in mood or affect. No depression or anxiety.  No memory loss.   Vital Signs BP 128/76 (BP Location: Left Arm, Cuff Size: Normal)   Pulse 83   Ht 6\' 1"  (1.854 m)   Wt 213 lb (96.6 kg)   SpO2 98%   BMI 28.10 kg/m    Physical Exam:  General- No distress,  A&Ox3, pleasant ENT: No sinus tenderness, TM clear, pale nasal mucosa, no oral exudate,+ post nasal drip, no LAN Cardiac: S1, S2, regular rate and rhythm, no murmur Chest: + wheeze/ no rales/ dullness; no accessory muscle use, no nasal flaring, no sternal retractions Abd.: Soft Non-tender,, large Ext: No clubbing cyanosis, edema Neuro:  normal strength Skin: No rashes, warm and dry Psych: normal mood and behavior   Assessment/Plan  Dyspnea Please start using your Singulair Inhaler 2 puffs twice daily.( Maintenance medication, each day without fail.) Please start using your Albuterol Inhaler 2 puffs up to every 6 hours as needed for shortness of breath or wheezing. ( Rescue inhaler. You use this with activities you know cause you shortness of breath.) We will give you a spacer to ease the use of the inhalers. We will instruct you on inhaler use. Remember to rinse you mouth after using your Symbicort. Take you antihistamine daily to control your post nasal drip. Sips of water instead of throat clearing. Avoid mint and menthol if possible. Try sugar free jolly ranchers for throat soothing. Follow up with Dr. Isaiah Serge in 3 months after using your maintenance medication consistently.. Continue your weight loss efforts as this will also help with your shortness of breath/ deconditioning. Please let us know if you change your mind about a flu shot. Please contact office for sooner follow up if symptoms do not improve or worsen or seek emergency care      Bevelyn Ngo, NP 01/10/2016  2:25 PM  Attending note: I have seen and examined the  patient with nurse practitioner/resident and agree with the note. History, labs and imaging reviewed.  60 Y/O with evaluated for dyspnea on exertion. Symptoms are suggestive of reactive airway disease with allergic component. He was started on symbicort at last visit but had not been taking it and symptoms remain the same. His dyspnea may be related too in part to his deconditioning and weight gain.   We have instructed him on the the proper use of the inhaler and encouraged regular use. Encouraged weight loss and exercise. Rest of plan as above,  Chilton Greathouse MD [facility] Pager [phone] If no answer or after 3pm call: [phone] 01/12/2016, 10:30 AM

## 2016-01-10 NOTE — Patient Instructions (Addendum)
It is nice to meet you today. Please start using your Singulair Inhaler 2 puffs twice daily.( Maintenance medication, each day without fail.) Please start using your Albuterol Inhaler 2 puffs up to every 6 hours as needed for shortness of breath or wheezing. ( Rescue inhaler. You use this with activities you know cause you shortness of breath.) We will give you a spacer to ease the use of the inhalers. We will instruct you on inhaler use. Remember to rinse you mouth after using your Symbicort. Take you antihistamine daily to control your post nasal drip. Sips of water instead of throat clearing. Avoid mint and menthol if possible. Try sugar free jolly ranchers for throat soothing. Follow up with Dr. Isaiah SergeMannam in 3 months after using your maintenance medication consistently.. Continue your weight loss efforts as this will also help with your shortness of breath/ deconditioning. Please let us know if you change your mind about a flu shot. Please contact office for sooner follow up if symptoms do not improve or worsen or seek emergency care

## 2016-01-11 MED ORDER — AEROCHAMBER MV MISC: 0 refills | Status: AC

## 2016-01-11 NOTE — Progress Notes (Signed)
Patient seen in the office today and instructed on use of Symbicort 80 and Aerochamber.  Patient expressed understanding and demonstrated technique. Boone MasterJessica Jones [facility] Hospital[facility]MA 01/10/2016

## 2016-01-13 ENCOUNTER — Encounter: Payer: Self-pay | Admitting: Pulmonary Disease

## 2016-01-14 NOTE — Telephone Encounter (Signed)
PM please advise. Thanks  

## 2016-01-15 NOTE — Telephone Encounter (Signed)
I called and left a message on his voicemail. This is an unusual to the symbicort. Even so I asked him to stop the inhaler. We will need to discuss if he wants to try another inhaler. If he is willing then give him advair 250/50.

## 2016-04-02 IMAGING — CT CT HEART SCORING
1 of 2 series · 8 of 20 positions shown, 10 images · non-contrast
Comparison: None.

CLINICAL DATA: Risk stratification

EXAM:
Coronary Calcium Score
TECHNIQUE: The patient was scanned on a Siemens Sensation 16 slice scanner.
Axial non-contrast 3mm slices were carried out through the heart.
The data set was analyzed on a dedicated work station and scored
using the Agatson method.

[Series 3: calcium score · axial · 0.39mm/px · z∈[-205,-112]mm · 8 of 41 slices shown, 10 images]
[im 5/41  vessel]
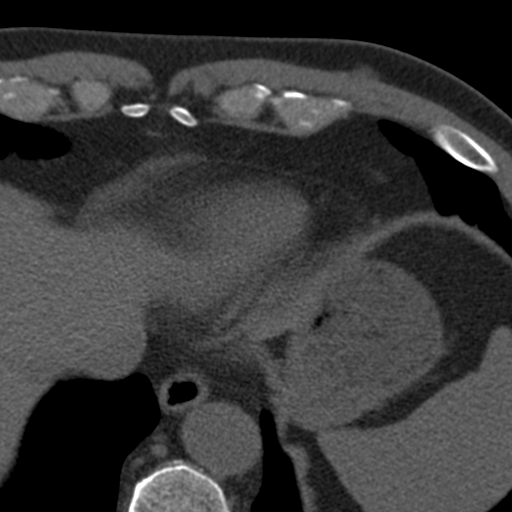
[im 5/41  lung]
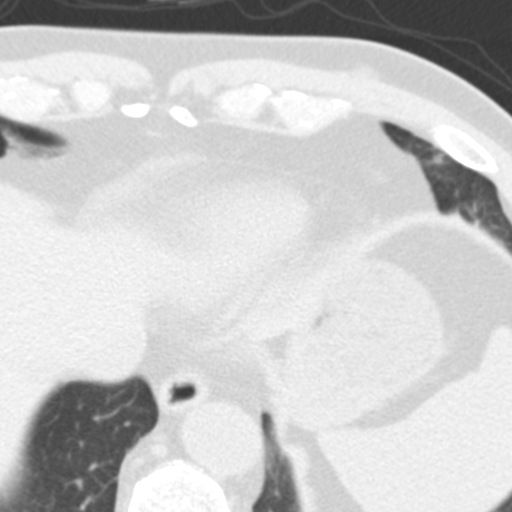
[im 9/41  vessel]
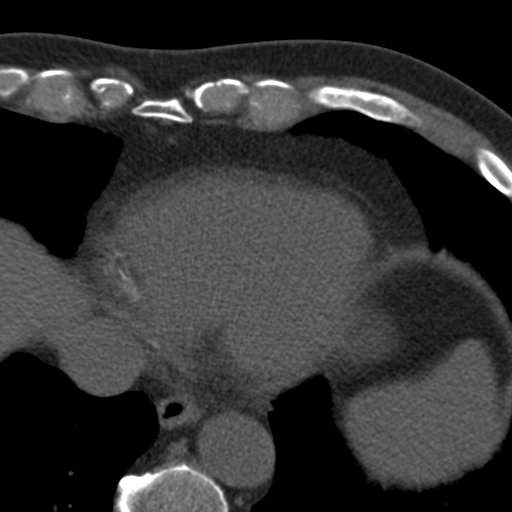
[im 14/41  vessel]
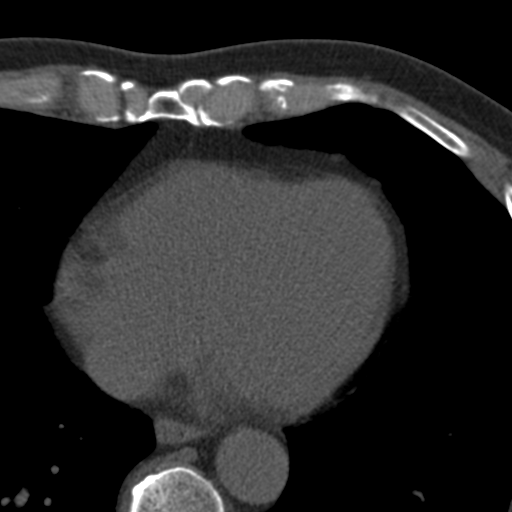
[im 18/41  vessel]
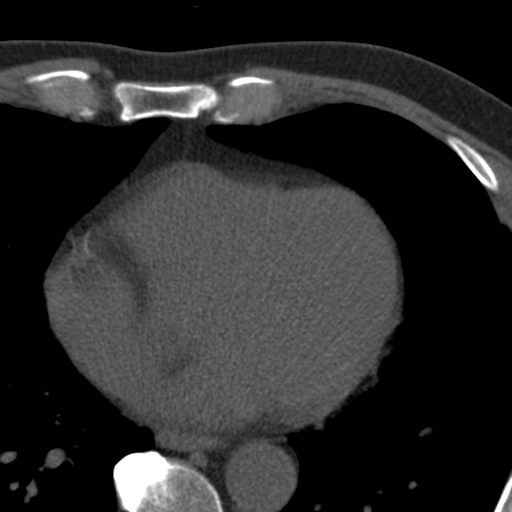
[im 23/41  vessel]
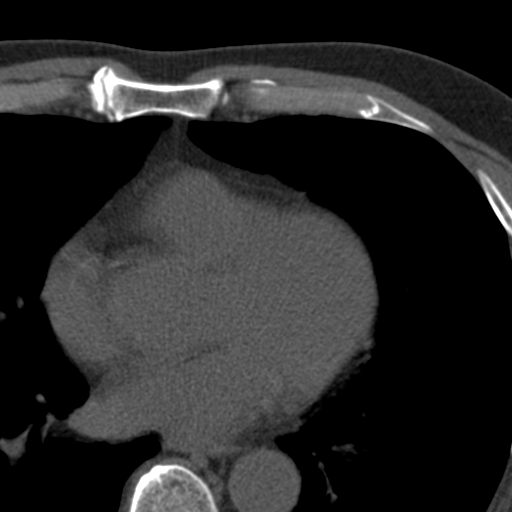
[im 23/41  lung]
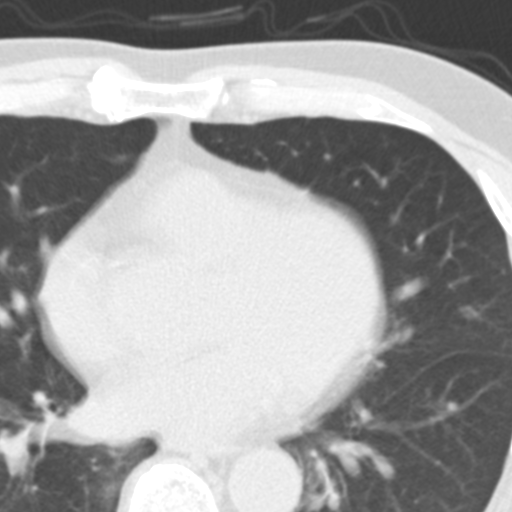
[im 27/41  vessel]
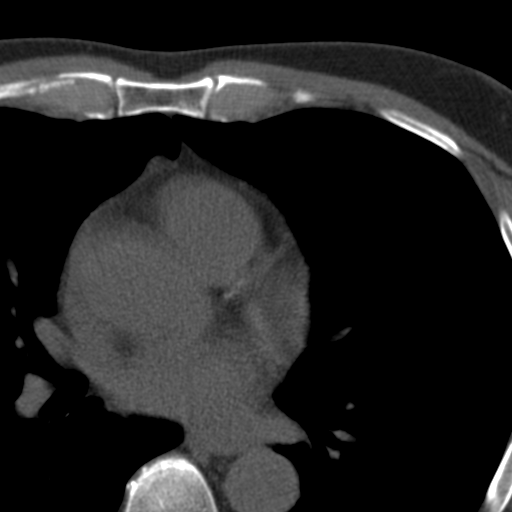
[im 32/41  vessel]
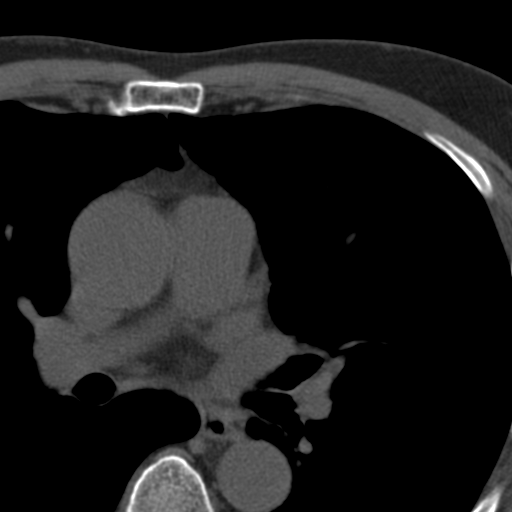
[im 36/41  vessel]
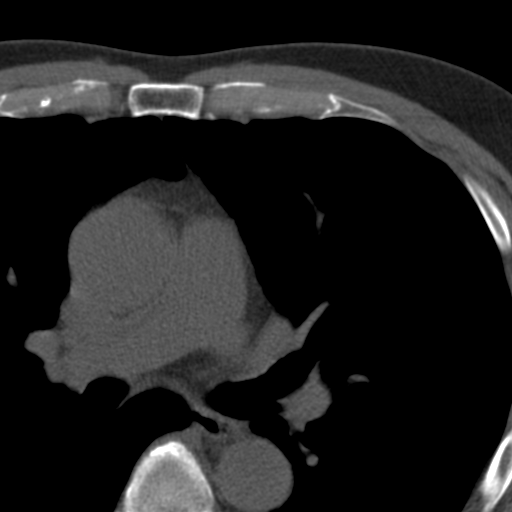

[8 of 20 positions shown; findings below may reference images not displayed]

FINDINGS: Non-cardiac: No significant non cardiac findings on limited lung and
soft tissue windows. See separate report from [REDACTED].

Ascending Aorta:  3.3 cm

Pericardium: Normal

Coronary arteries: Scattered calcification of mid/distal LAD and mid
and distal RCA

EXAM:
OVER-READ INTERPRETATION  CT CHEST

The following report is an over-read performed by radiologist Dr.
over-read does not include interpretation of cardiac or coronary
anatomy or pathology. The coronary calcium score/coronary CTA
interpretation by the cardiologist is attached.
FINDINGS: There are scattered subpleural nodules measuring up to 4 mm in the
lingula. Mild scarring in the lingula and left lower lobe, as well
as right middle lobe. Extracardiac structures are otherwise
unremarkable.
IMPRESSION: Scattered subpleural nodules, likely subpleural lymph nodes. If the
patient is at high risk for bronchogenic carcinoma, follow-up chest
CT at 1 year is recommended. If the patient is at low risk, no
follow-up is needed. This recommendation follows the consensus
statement: Guidelines for Management of Small Pulmonary Nodules
Detected on CT Scans: A Statement from the [HOSPITAL] as

## 2016-04-10 ENCOUNTER — Encounter: Payer: Self-pay | Admitting: Physician Assistant

## 2016-04-10 DIAGNOSIS — E785 Hyperlipidemia, unspecified: Secondary | ICD-10-CM

## 2016-04-11 ENCOUNTER — Ambulatory Visit (INDEPENDENT_AMBULATORY_CARE_PROVIDER_SITE_OTHER): Payer: BLUE CROSS/BLUE SHIELD | Admitting: Pulmonary Disease

## 2016-04-11 ENCOUNTER — Encounter: Payer: Self-pay | Admitting: Pulmonary Disease

## 2016-04-11 VITALS — BP 128/78 | HR 83 | Ht 72.0 in | Wt 225.6 lb

## 2016-04-11 DIAGNOSIS — R0689 Other abnormalities of breathing: Secondary | ICD-10-CM

## 2016-04-11 DIAGNOSIS — R0600 Dyspnea, unspecified: Secondary | ICD-10-CM | POA: Diagnosis not present

## 2016-04-11 MED ORDER — FLUTICASONE FUROATE-VILANTEROL 100-25 MCG/INH IN AEPB
1.0000 | INHALATION_SPRAY | Freq: Every day | RESPIRATORY_TRACT | 0 refills | Status: DC
Start: 2016-04-11 — End: 2016-11-14

## 2016-04-11 MED ORDER — FLUTICASONE FUROATE-VILANTEROL 100-25 MCG/INH IN AEPB: 1.0000 | INHALATION_SPRAY | Freq: Every day | RESPIRATORY_TRACT | 1 refills | Status: DC

## 2016-04-11 NOTE — Progress Notes (Signed)
Steve GottronMichael Schwartz    [id number]    03/16/1956  Primary Care Physician:Martin, Sherley BoundsWilliam Cody, PA-C  Referring Physician: Waldon MerlWilliam C Martin, PA-C [address] [place], [place] [id number]  Chief complaint:   Follow up for mild persistent asthma  HPI: Steve Schwartz 60 year old with history of allergies. He presents with dyspnea for 2-3 years. This has been progressive in nature. He gets short of breath during exercise.This is associated occasional wheeze and chest congestion.  He used to do 100 sit-ups in the past but now can manage only about 50. He does have recurrent bronchitis. He suffers from allergic rhinitis, seasonal allergies. He is allergic to house dust, trees, pollen. His breathing usually worse during winter. He has gained about 10 pounds over a period of 2-3 years. His weight gain corresponds to the onset of his dyspnea. He has been prescribed albuterol but has not started taking it yet.  Interim History: At last visit he was given Symbicort but had to stop taking it due to blistering over his lower lip. He has not been on any inhalers since September 2017. He has noticed an increase in his symptoms of dyspnea with wheeze, chest congestion over the past 3 months. Denies any nighttime awakenings.  Outpatient Encounter Prescriptions as of 04/11/2016  Medication Sig  . chlorpheniramine (CHLOR-TRIMETON) 4 MG tablet Take 4 mg by mouth as needed for allergies.  Marland Kitchen. ibuprofen (ADVIL,MOTRIN) 200 MG tablet Take 200 mg by mouth at bedtime.   . Multiple Vitamin (MULTIVITAMIN) tablet Take 1 tablet by mouth daily.  Marland Kitchen. OVER THE COUNTER MEDICATION Take 1 tablet by mouth daily. VSC- by [employer]  . pseudoephedrine (SUDAFED) 30 MG tablet Take 30 mg by mouth every 4 (four) hours as needed for congestion (for Flying).  . Spacer/Aero-Holding Chambers (AEROCHAMBER MV) inhaler Use as instructed  . vitamin C (ASCORBIC ACID) 500 MG tablet Take 500 mg by mouth daily.  Marland Kitchen. albuterol (PROVENTIL  HFA;VENTOLIN HFA) 108 (90 BASE) MCG/ACT inhaler Inhale 2 puffs into the lungs every 6 (six) hours as needed for wheezing or shortness of breath. (Patient not taking: Reported on 04/11/2016)  . rosuvastatin (CRESTOR) 10 MG tablet Take 1 tablet (10 mg total) by mouth daily. (Patient not taking: Reported on 04/11/2016)  . [DISCONTINUED] budesonide-formoterol (SYMBICORT) 80-4.5 MCG/ACT inhaler Inhale 2 puffs into the lungs 2 (two) times daily. (Patient not taking: Reported on 04/11/2016)  . [DISCONTINUED] NON FORMULARY Take 1 tablet by mouth daily. [employer] ProstaCare Supplement.  . [DISCONTINUED] NON FORMULARY Take 1 tablet by mouth daily. New Chapter: Prostate 5LX Supplement   No facility-administered encounter medications on file as of 04/11/2016.     Allergies as of 04/11/2016  . (No Known Allergies)    Past Medical History:  Diagnosis Date  . Allergy    dust, mold  . AR (allergic rhinitis)   . Chicken pox   . H/O vitamin D deficiency   . Hay fever   . Measles   . Mumps     Past Surgical History:  Procedure Laterality Date  . ROOT CANAL  07/28/14  . TONSILLECTOMY AND ADENOIDECTOMY  1959  . WISDOM TOOTH EXTRACTION      Family History  Problem Relation Age of Onset  . Hypertension Mother   . Uterine cancer Mother   . Thyroid disease Mother   . Thyroid disease Sister     x2  . Emphysema Father   . Lung disease Father   .  Other Father     Blood Disorder  . Myelodysplastic syndrome Father   . Stroke Paternal Grandmother   . Heart disease Paternal Grandfather   . Heart disease Maternal Grandfather   . Obesity Sister   . Healthy Daughter     x2  . Diabetes Other     Social History   Social History  . Marital status: Married    Spouse name: N/A  . Number of children: N/A  . Years of education: N/A   Occupational History  . Not on file.   Social History Main Topics  . Smoking status: Never Smoker  . Smokeless tobacco: Never Used  . Alcohol use 0.0 oz/week      Comment: rare  . Drug use: No  . Sexual activity: Yes    Birth control/ protection: None   Other Topics Concern  . Not on file   Social History Narrative  . No narrative on file   Review of systems: Review of Systems  Constitutional: Negative for fever and chills.  HENT: Negative.   Eyes: Negative for blurred vision.  Respiratory: as per HPI  Cardiovascular: Negative for chest pain and palpitations.  Gastrointestinal: Negative for vomiting, diarrhea, blood per rectum. Genitourinary: Negative for dysuria, urgency, frequency and hematuria.  Musculoskeletal: Negative for myalgias, back pain and joint pain.  Skin: Negative for itching and rash.  Neurological: Negative for dizziness, tremors, focal weakness, seizures and loss of consciousness.  Endo/Heme/Allergies: Negative for environmental allergies.  Psychiatric/Behavioral: Negative for depression, suicidal ideas and hallucinations.  All other systems reviewed and are negative.  Physical Exam: Blood pressure 128/78, pulse 83, height 6' (1.829 m), weight 225 lb 9.6 oz (102.3 kg), SpO2 98 %. Gen:      No acute distress HEENT:  EOMI, sclera anicteric Neck:     No masses; no thyromegaly Lungs:    Clear to auscultation bilaterally; normal respiratory effort CV:         Regular rate and rhythm; no murmurs Abd:      + bowel sounds; soft, non-tender; no palpable masses, no distension Ext:    No edema; adequate peripheral perfusion Skin:      Warm and dry; no rash Neuro: alert and oriented x 3 Psych: normal mood and affect  Data Reviewed: Echo (01/09/15) Normal LV function; grade 1 diastolic dysfunction; mildly dilated aortic root and ascending aorta; trace TR.  CXR (03/01/15) No active cardiopulmonary disease. Images reviewed  PFTs 04/09/15 FVC 5.07 [97%) FEV1 2.83 [97%) F/F 76 TLC 7.50 [95%) DLCO 83%. Normal pulmonary function. No bronchodilator response.  Assessment:  Mild persistent asthma  He likely has  asthma/reactive airway disease even though his pulmonary function tests are normal with no bronchodilator response. He does have symptoms of allergies and continues to wheezing in the office today. He has not tolerated Symbicort due to blistering on lips We'll start him on Breo and continue the albuterol as needed and also before he exerts himself.  He has gained some weight and his dyspnea may be related in part to his deconditioning and weight gain. I have asked him to get on an exercise program and lose some weight with diet. She continues to be symptomatic then we can consider a cardiopulmonary exercise test.  Plan/Recommendations: - D/C Symbicort, start breo  Chilton GreathousePraveen Amare Kontos MD [facility] Pager [phone] 04/11/2016, 1:45 PM  CC: Waldon MerlMartin, William C, PA-C

## 2016-04-11 NOTE — Patient Instructions (Signed)
Stop taking the Symbicort. Will start you on Breo inhaler 100 Follow up in 3 months.

## 2016-04-18 ENCOUNTER — Other Ambulatory Visit (INDEPENDENT_AMBULATORY_CARE_PROVIDER_SITE_OTHER): Payer: BLUE CROSS/BLUE SHIELD

## 2016-04-18 ENCOUNTER — Encounter: Payer: Self-pay | Admitting: Physician Assistant

## 2016-04-18 DIAGNOSIS — E785 Hyperlipidemia, unspecified: Secondary | ICD-10-CM

## 2016-04-18 LAB — HEPATIC FUNCTION PANEL
ALK PHOS: 73 U/L (ref 39–117)
ALT: 33 U/L (ref 0–53)
AST: 26 U/L (ref 0–37)
Albumin: 4.5 g/dL (ref 3.5–5.2)
BILIRUBIN TOTAL: 0.5 mg/dL (ref 0.2–1.2)
Bilirubin, Direct: 0.1 mg/dL (ref 0.0–0.3)
Total Protein: 7 g/dL (ref 6.0–8.3)

## 2016-04-18 LAB — LIPID PANEL
CHOLESTEROL: 215 mg/dL — AB (ref 0–200)
HDL: 35.3 mg/dL — ABNORMAL LOW (ref >39.00)
LDL Cholesterol: 160 mg/dL — ABNORMAL HIGH (ref 0–99)
NONHDL: 180.17
Total CHOL/HDL Ratio: 6
Triglycerides: 99 mg/dL (ref 0.0–149.0)
VLDL: 19.8 mg/dL (ref 0.0–40.0)

## 2016-04-20 MED ORDER — ROSUVASTATIN CALCIUM 10 MG PO TABS: 10.0000 mg | ORAL_TABLET | Freq: Every day | ORAL | 1 refills | Status: DC

## 2016-07-16 ENCOUNTER — Ambulatory Visit: Payer: BLUE CROSS/BLUE SHIELD | Admitting: Pulmonary Disease

## 2016-08-22 ENCOUNTER — Encounter: Payer: Self-pay | Admitting: Physician Assistant

## 2016-11-10 ENCOUNTER — Encounter: Payer: Self-pay | Admitting: Physician Assistant

## 2016-11-14 ENCOUNTER — Ambulatory Visit (INDEPENDENT_AMBULATORY_CARE_PROVIDER_SITE_OTHER): Payer: BLUE CROSS/BLUE SHIELD | Admitting: Pulmonary Disease

## 2016-11-14 ENCOUNTER — Encounter: Payer: Self-pay | Admitting: Pulmonary Disease

## 2016-11-14 VITALS — BP 186/124 | HR 95 | Ht 72.0 in | Wt 224.4 lb

## 2016-11-14 DIAGNOSIS — R0602 Shortness of breath: Secondary | ICD-10-CM

## 2016-11-14 DIAGNOSIS — J4530 Mild persistent asthma, uncomplicated: Secondary | ICD-10-CM | POA: Diagnosis not present

## 2016-11-14 LAB — NITRIC OXIDE: NITRIC OXIDE: 34

## 2016-11-14 MED ORDER — NEBIVOLOL HCL 5 MG PO TABS: 5.0000 mg | ORAL_TABLET | Freq: Every day | ORAL | 0 refills | Status: DC

## 2016-11-14 NOTE — Progress Notes (Addendum)
Barlow Harrison    [id number]    Mar 17, 1956  Primary Care Physician:Martin, Kristine Garbe, PA-C  Referring Physician: Waldon Merl, PA-C [address] [place], [place] [address]  Chief complaint:   Follow up for mild persistent asthma  HPI: Mr. Broughton 61 year old with history of allergies. He presents with dyspnea for 2-3 years. This has been progressive in nature. He gets short of breath during exercise.This is associated occasional wheeze and chest congestion.  He used to do 100 sit-ups in the past but now can manage only about 50. He does have recurrent bronchitis. He suffers from allergic rhinitis, seasonal allergies. He is allergic to house dust, trees, pollen. His breathing usually worse during winter. He has gained about 10 pounds over a period of 2-3 years. His weight gain corresponds to the onset of his dyspnea. He stopped symbicort due to blistering over the lower lip.  Interim History: He has given breo at last visit but is not taking it on a regular basis. He still has dyspnea on exertion and occasionally at rest with wheezing, chest congestion.  Noted to have high blood pressure ranging from 160-180 systolic. He remains asymptomatic. He reports eating salty lunch today, taking herbal supplements nyquil and having stress at work. We gave him a dose of Bystolic 5 mg to see if can improve his blood pressure but it remained high. I have recommended that he go to the ED but he declined.  Outpatient Encounter Prescriptions as of 11/14/2016  Medication Sig  . albuterol (PROVENTIL HFA;VENTOLIN HFA) 108 (90 BASE) MCG/ACT inhaler Inhale 2 puffs into the lungs every 6 (six) hours as needed for wheezing or shortness of breath.  . fluticasone furoate-vilanterol (BREO ELLIPTA) 100-25 MCG/INH AEPB Inhale 1 puff into the lungs daily.  Marland Kitchen ibuprofen (ADVIL,MOTRIN) 200 MG tablet Take 200 mg by mouth at bedtime.   . Multiple Vitamin (MULTIVITAMIN) tablet Take 1 tablet by mouth daily.    Marland Kitchen OVER THE COUNTER MEDICATION Take 1 tablet by mouth daily. VSC- by Nature's Sunshine  . pseudoephedrine (SUDAFED) 30 MG tablet Take 30 mg by mouth every 4 (four) hours as needed for congestion (for Flying).  . Spacer/Aero-Holding Chambers (AEROCHAMBER MV) inhaler Use as instructed  . vitamin C (ASCORBIC ACID) 500 MG tablet Take 500 mg by mouth daily.  . [DISCONTINUED] fluticasone furoate-vilanterol (BREO ELLIPTA) 100-25 MCG/INH AEPB Inhale 1 puff into the lungs daily.  . [DISCONTINUED] rosuvastatin (CRESTOR) 10 MG tablet Take 1 tablet (10 mg total) by mouth daily.  . chlorpheniramine (CHLOR-TRIMETON) 4 MG tablet Take 4 mg by mouth as needed for allergies.   No facility-administered encounter medications on file as of 11/14/2016.     Allergies as of 11/14/2016  . (No Known Allergies)    Past Medical History:  Diagnosis Date  . Allergy    dust, mold  . AR (allergic rhinitis)   . Chicken pox   . H/O vitamin D deficiency   . Hay fever   . Measles   . Mumps     Past Surgical History:  Procedure Laterality Date  . ROOT CANAL  07/28/14  . TONSILLECTOMY AND ADENOIDECTOMY  1959  . WISDOM TOOTH EXTRACTION      Family History  Problem Relation Age of Onset  . Hypertension Mother   . Uterine cancer Mother   . Thyroid disease Mother   . Emphysema Father   . Lung disease Father   . Other Father  Blood Disorder  . Myelodysplastic syndrome Father   . Stroke Paternal Grandmother   . Heart disease Paternal Grandfather   . Heart disease Maternal Grandfather   . Thyroid disease Sister        x2  . Obesity Sister   . Healthy Daughter        x2  . Diabetes Other     Social History   Social History  . Marital status: Married    Spouse name: N/A  . Number of children: N/A  . Years of education: N/A   Occupational History  . Not on file.   Social History Main Topics  . Smoking status: Never Smoker  . Smokeless tobacco: Never Used  . Alcohol use 0.0 oz/week      Comment: rare  . Drug use: No  . Sexual activity: Yes    Birth control/ protection: None   Other Topics Concern  . Not on file   Social History Narrative  . No narrative on file   Review of systems: Review of Systems  Constitutional: Negative for fever and chills.  HENT: Negative.   Eyes: Negative for blurred vision.  Respiratory: as per HPI  Cardiovascular: Negative for chest pain and palpitations.  Gastrointestinal: Negative for vomiting, diarrhea, blood per rectum. Genitourinary: Negative for dysuria, urgency, frequency and hematuria.  Musculoskeletal: Negative for myalgias, back pain and joint pain.  Skin: Negative for itching and rash.  Neurological: Negative for dizziness, tremors, focal weakness, seizures and loss of consciousness.  Endo/Heme/Allergies: Negative for environmental allergies.  Psychiatric/Behavioral: Negative for depression, suicidal ideas and hallucinations.  All other systems reviewed and are negative.  Physical Exam: Blood pressure (!) 160/82, pulse 95, height 6' (1.829 m), weight 224 lb 6.4 oz (101.8 kg), SpO2 96 %. Gen:      No acute distress HEENT:  EOMI, sclera anicteric Neck:     No masses; no thyromegaly Lungs:    Mild exp wheeze; normal respiratory effort CV:         Regular rate and rhythm; no murmurs Abd:      + bowel sounds; soft, non-tender; no palpable masses, no distension Ext:    No edema; adequate peripheral perfusion Skin:      Warm and dry; no rash Neuro: alert and oriented x 3 Psych: normal mood and affect  Data Reviewed: Echo (01/09/15) Normal LV function; grade 1 diastolic dysfunction; mildly dilated aortic root and ascending aorta; trace TR.  CXR (03/01/15) No active cardiopulmonary disease. Images reviewed  PFTs 04/09/15 FVC 5.07 [97%) FEV1 2.83 [97%) F/F 76 TLC 7.50 [95%) DLCO 83%. Normal pulmonary function. No bronchodilator response.  FENO 11/14/16- 34  Assessment:  Mild persistent asthma He likely has  asthma/reactive airway disease even though his pulmonary function tests are normal with no bronchodilator response. FENO is mildly elevated and he continues to wheeze. He has not tolerated Symbicort due to blistering on lips. He was given Virgel BouquetBreo but has not started using it. I instructed him to start breo in a regular basis and continue the albuterol as needed and also before he exerts himself. We will check CBC with diff and a blood allergy profile.   He has gained some weight and his dyspnea may be related in part to his deconditioning and weight gain. I have asked him to get on an exercise program and lose some weight with diet. If he continues to be symptomatic then we can consider a cardiopulmonary exercise test.  Elevated BP His BP noted to  be high in office today ranging from SBP 160-180s but he remained asympomatic I recommended that he go to ED for further eval but he declined He will stop the herbal supplements and NyQuil and get in touch with his PCP to be seen sooner If he feels worse then he will go to ED.   Plan/Recommendations: - Start breo - Check CBC with diff and blood allergy profile  Chilton Greathouse MD  Pulmonary and Critical Care Pager [phone] 11/14/2016, 2:00 PM  CC: Waldon Merl, [place]

## 2016-11-14 NOTE — Patient Instructions (Addendum)
Start using the breo on a regular basis We checked a CBC differential and a blood rheumatology profile today. Return in 6 months.

## 2016-11-14 NOTE — Addendum Note (Signed)
Addended by: Maxwell MarionBLANKENSHIP, MARGIE A on: 11/14/2016 03:08 PM   Modules accepted: Orders

## 2016-12-19 ENCOUNTER — Ambulatory Visit (INDEPENDENT_AMBULATORY_CARE_PROVIDER_SITE_OTHER): Payer: BLUE CROSS/BLUE SHIELD | Admitting: Physician Assistant

## 2016-12-19 ENCOUNTER — Encounter: Payer: Self-pay | Admitting: Physician Assistant

## 2016-12-19 ENCOUNTER — Other Ambulatory Visit (INDEPENDENT_AMBULATORY_CARE_PROVIDER_SITE_OTHER): Payer: BLUE CROSS/BLUE SHIELD

## 2016-12-19 VITALS — BP 130/80 | HR 71 | Temp 98.4°F | Resp 14 | Ht 73.0 in | Wt 219.0 lb

## 2016-12-19 DIAGNOSIS — Z0000 Encounter for general adult medical examination without abnormal findings: Secondary | ICD-10-CM | POA: Diagnosis not present

## 2016-12-19 DIAGNOSIS — Z8679 Personal history of other diseases of the circulatory system: Secondary | ICD-10-CM

## 2016-12-19 DIAGNOSIS — Z125 Encounter for screening for malignant neoplasm of prostate: Secondary | ICD-10-CM

## 2016-12-19 DIAGNOSIS — R0602 Shortness of breath: Secondary | ICD-10-CM | POA: Insufficient documentation

## 2016-12-19 DIAGNOSIS — J309 Allergic rhinitis, unspecified: Secondary | ICD-10-CM

## 2016-12-19 LAB — URINALYSIS, ROUTINE W REFLEX MICROSCOPIC
Bilirubin Urine: NEGATIVE
Hgb urine dipstick: NEGATIVE
KETONES UR: NEGATIVE
Leukocytes, UA: NEGATIVE
NITRITE: NEGATIVE
RBC / HPF: NONE SEEN (ref 0–?)
SPECIFIC GRAVITY, URINE: 1.01 (ref 1.000–1.030)
Total Protein, Urine: NEGATIVE
Urine Glucose: NEGATIVE
Urobilinogen, UA: 0.2 (ref 0.0–1.0)
pH: 7.5 (ref 5.0–8.0)

## 2016-12-19 LAB — TSH: TSH: 4.62 u[IU]/mL — ABNORMAL HIGH (ref 0.35–4.50)

## 2016-12-19 LAB — COMPREHENSIVE METABOLIC PANEL
ALK PHOS: 84 U/L (ref 39–117)
ALT: 24 U/L (ref 0–53)
AST: 20 U/L (ref 0–37)
Albumin: 4.6 g/dL (ref 3.5–5.2)
BUN: 11 mg/dL (ref 6–23)
CHLORIDE: 103 meq/L (ref 96–112)
CO2: 27 mEq/L (ref 19–32)
Calcium: 9.3 mg/dL (ref 8.4–10.5)
Creatinine, Ser: 0.8 mg/dL (ref 0.40–1.50)
GFR: 104.01 mL/min (ref >60.00)
GLUCOSE: 95 mg/dL (ref 70–99)
POTASSIUM: 4.2 meq/L (ref 3.5–5.1)
Sodium: 136 mEq/L (ref 135–145)
TOTAL PROTEIN: 7.1 g/dL (ref 6.0–8.3)
Total Bilirubin: 0.5 mg/dL (ref 0.2–1.2)

## 2016-12-19 LAB — CBC WITH DIFFERENTIAL/PLATELET
BASOS ABS: 0.1 10*3/uL (ref 0.0–0.1)
BASOS PCT: 1 % (ref 0.0–3.0)
EOS ABS: 0.1 10*3/uL (ref 0.0–0.7)
Eosinophils Relative: 1.7 % (ref 0.0–5.0)
HCT: 45.4 % (ref 39.0–52.0)
Hemoglobin: 15.3 g/dL (ref 13.0–17.0)
LYMPHS PCT: 27 % (ref 12.0–46.0)
Lymphs Abs: 1.4 10*3/uL (ref 0.7–4.0)
MCHC: 33.8 g/dL (ref 30.0–36.0)
MCV: 83.5 fl (ref 78.0–100.0)
MONO ABS: 0.5 10*3/uL (ref 0.1–1.0)
MONOS PCT: 8.8 % (ref 3.0–12.0)
NEUTROS ABS: 3.2 10*3/uL (ref 1.4–7.7)
Neutrophils Relative %: 61.5 % (ref 43.0–77.0)
PLATELETS: 267 10*3/uL (ref 150.0–400.0)
RBC: 5.44 Mil/uL (ref 4.22–5.81)
RDW: 13.2 % (ref 11.5–15.5)
WBC: 5.2 10*3/uL (ref 4.0–10.5)

## 2016-12-19 LAB — HEMOGLOBIN A1C: Hgb A1c MFr Bld: 5.6 % (ref 4.6–6.5)

## 2016-12-19 LAB — LIPID PANEL
Cholesterol: 211 mg/dL — ABNORMAL HIGH (ref 0–200)
HDL: 29.5 mg/dL — AB (ref >39.00)
LDL Cholesterol: 158 mg/dL — ABNORMAL HIGH (ref 0–99)
NONHDL: 181.55
Total CHOL/HDL Ratio: 7
Triglycerides: 120 mg/dL (ref 0.0–149.0)
VLDL: 24 mg/dL (ref 0.0–40.0)

## 2016-12-19 LAB — T4, FREE: Free T4: 0.75 ng/dL (ref 0.60–1.60)

## 2016-12-19 LAB — PSA: PSA: 1.28 ng/mL (ref 0.10–4.00)

## 2016-12-19 NOTE — Assessment & Plan Note (Signed)
In patient with history of asthma and Grade I diastolic function noted on echo. Vitals stable today. Patient euvolemic on exam. Will get repeat Echo. Follow-up with Pulmonology as scheduled.

## 2016-12-19 NOTE — Patient Instructions (Signed)
Please go to the lab for blood work.   Our office will call you with your results unless you have chosen to receive results via MyChart.  If your blood work is normal we will follow-up each year for physicals and as scheduled for chronic medical problems.  If anything is abnormal we will treat accordingly and get you in for a follow-up.  You will be contacted for an echocardiogram. Follow-up with Pulmonology as scheduled.    Preventive Care 40-64 Years, Male Preventive care refers to lifestyle choices and visits with your health care provider that can promote health and wellness. What does preventive care include?  A yearly physical exam. This is also called an annual well check.  Dental exams once or twice a year.  Routine eye exams. Ask your health care provider how often you should have your eyes checked.  Personal lifestyle choices, including: ? Daily care of your teeth and gums. ? Regular physical activity. ? Eating a healthy diet. ? Avoiding tobacco and drug use. ? Limiting alcohol use. ? Practicing safe sex. ? Taking low-dose aspirin every day starting at age 55. What happens during an annual well check? The services and screenings done by your health care provider during your annual well check will depend on your age, overall health, lifestyle risk factors, and family history of disease. Counseling Your health care provider may ask you questions about your:  Alcohol use.  Tobacco use.  Drug use.  Emotional well-being.  Home and relationship well-being.  Sexual activity.  Eating habits.  Work and work Statistician.  Screening You may have the following tests or measurements:  Height, weight, and BMI.  Blood pressure.  Lipid and cholesterol levels. These may be checked every 5 years, or more frequently if you are over 2 years old.  Skin check.  Lung cancer screening. You may have this screening every year starting at age 65 if you have a  30-pack-year history of smoking and currently smoke or have quit within the past 15 years.  Fecal occult blood test (FOBT) of the stool. You may have this test every year starting at age 46.  Flexible sigmoidoscopy or colonoscopy. You may have a sigmoidoscopy every 5 years or a colonoscopy every 10 years starting at age 78.  Prostate cancer screening. Recommendations will vary depending on your family history and other risks.  Hepatitis C blood test.  Hepatitis B blood test.  Sexually transmitted disease (STD) testing.  Diabetes screening. This is done by checking your blood sugar (glucose) after you have not eaten for a while (fasting). You may have this done every 1-3 years.  Discuss your test results, treatment options, and if necessary, the need for more tests with your health care provider. Vaccines Your health care provider may recommend certain vaccines, such as:  Influenza vaccine. This is recommended every year.  Tetanus, diphtheria, and acellular pertussis (Tdap, Td) vaccine. You may need a Td booster every 10 years.  Varicella vaccine. You may need this if you have not been vaccinated.  Zoster vaccine. You may need this after age 4.  Measles, mumps, and rubella (MMR) vaccine. You may need at least one dose of MMR if you were born in 1957 or later. You may also need a second dose.  Pneumococcal 13-valent conjugate (PCV13) vaccine. You may need this if you have certain conditions and have not been vaccinated.  Pneumococcal polysaccharide (PPSV23) vaccine. You may need one or two doses if you smoke cigarettes or if you  have certain conditions.  Meningococcal vaccine. You may need this if you have certain conditions.  Hepatitis A vaccine. You may need this if you have certain conditions or if you travel or work in places where you may be exposed to hepatitis A.  Hepatitis B vaccine. You may need this if you have certain conditions or if you travel or work in places where  you may be exposed to hepatitis B.  Haemophilus influenzae type b (Hib) vaccine. You may need this if you have certain risk factors.  Talk to your health care provider about which screenings and vaccines you need and how often you need them. This information is not intended to replace advice given to you by your health care provider. Make sure you discuss any questions you have with your health care provider. Document Released: 05/11/2015 Document Revised: 01/02/2016 Document Reviewed: 02/13/2015 Elsevier Interactive Patient Education  2017 [employer].

## 2016-12-19 NOTE — Progress Notes (Signed)
Patient presents to clinic today for annual exam.  Patient is fasting for labs. Diet -- Endorses well-balanced diet overall. Exercise -- Stays fairly active. Has notes some increased windedness with exertion.   Chronic Issues: Asthma -- Followed by Pulmonology. Previously on Spiriva causing a rash around mouth. NExt on [facility] which caused acute changes in BP levels. Is not currently on medication. Has follow-up scheduled. Denies any ongoing wheezing or chest tightness. Is noting worsening of dyspnea with exertion. Denies PND or orthopnea. Has had negative stress test in 12/16 along with echo revealing normal systolic function and grade I diastolic impairment.   Health Maintenance: Immunizations -- up-to-date Colonoscopy -- up-to-date  Past Medical History:  Diagnosis Date  . Allergy    dust, mold  . AR (allergic rhinitis)   . Chicken pox   . H/O vitamin D deficiency   . Hay fever   . Measles   . Mumps     Past Surgical History:  Procedure Laterality Date  . ROOT CANAL  07/28/14  . TONSILLECTOMY AND ADENOIDECTOMY  1959  . WISDOM TOOTH EXTRACTION      Current Outpatient Prescriptions on File Prior to Visit  Medication Sig Dispense Refill  . chlorpheniramine (CHLOR-TRIMETON) 4 MG tablet Take 4 mg by mouth as needed for allergies.    Marland Kitchen ibuprofen (ADVIL,MOTRIN) 200 MG tablet Take 200 mg by mouth at bedtime.     . Multiple Vitamin (MULTIVITAMIN) tablet Take 1 tablet by mouth daily.    Marland Kitchen OVER THE COUNTER MEDICATION Take 1 tablet by mouth daily. VSC- by Nature's Sunshine    . pseudoephedrine (SUDAFED) 30 MG tablet Take 30 mg by mouth every 4 (four) hours as needed for congestion (for Flying).    . Spacer/Aero-Holding Chambers (AEROCHAMBER MV) inhaler Use as instructed 1 each 0  . vitamin C (ASCORBIC ACID) 500 MG tablet Take 500 mg by mouth daily.     No current facility-administered medications on file prior to visit.     Allergies  Allergen Reactions  . Breo Ellipta  [Fluticasone Furoate-Vilanterol] Hypertension    Worsening of blood pressure  . Crestor [Rosuvastatin Calcium] Other (See Comments)    Brain fog    Family History  Problem Relation Age of Onset  . Hypertension Mother   . Uterine cancer Mother   . Thyroid disease Mother   . Emphysema Father   . Lung disease Father   . Other Father        Blood Disorder  . Myelodysplastic syndrome Father   . Stroke Paternal Grandmother   . Heart disease Paternal Grandfather   . Heart disease Maternal Grandfather   . Thyroid disease Sister        x2  . Obesity Sister   . Healthy Daughter        x2  . Diabetes Other     Social History   Social History  . Marital status: Married    Spouse name: N/A  . Number of children: N/A  . Years of education: N/A   Occupational History  . Not on file.   Social History Main Topics  . Smoking status: Never Smoker  . Smokeless tobacco: Never Used  . Alcohol use 0.0 oz/week     Comment: rare  . Drug use: No  . Sexual activity: Yes    Birth control/ protection: None   Other Topics Concern  . Not on file   Social History Narrative  . No narrative on file  Review of Systems  Constitutional: Negative for fever and weight loss.  HENT: Negative for ear discharge, ear pain, hearing loss and tinnitus.   Eyes: Negative for blurred vision, double vision, photophobia and pain.  Respiratory: Negative for cough and shortness of breath.   Cardiovascular: Negative for chest pain and palpitations.  Gastrointestinal: Negative for abdominal pain, blood in stool, constipation, diarrhea, heartburn, melena, nausea and vomiting.  Genitourinary: Negative for dysuria, flank pain, frequency, hematuria and urgency.  Musculoskeletal: Negative for falls.  Neurological: Negative for dizziness, loss of consciousness and headaches.  Endo/Heme/Allergies: Negative for environmental allergies.  Psychiatric/Behavioral: Negative for depression, hallucinations, substance  abuse and suicidal ideas. The patient is not nervous/anxious and does not have insomnia.    BP 130/80   Pulse 71   Temp 98.4 F (36.9 C) (Oral)   Resp 14   Ht 6\' 1"  (1.854 m)   Wt 219 lb (99.3 kg)   SpO2 97%   BMI 28.89 kg/m   Physical Exam  Constitutional: He is oriented to person, place, and time and well-developed, well-nourished, and in no distress.  HENT:  Head: Normocephalic and atraumatic.  Right Ear: External ear normal.  Left Ear: External ear normal.  Nose: Nose normal.  Mouth/Throat: Oropharynx is clear and moist. No oropharyngeal exudate.  Eyes: Pupils are equal, round, and reactive to light. Conjunctivae and EOM are normal.  Neck: Neck supple. No thyromegaly present.  Cardiovascular: Normal rate, regular rhythm, normal heart sounds and intact distal pulses.   Pulmonary/Chest: Effort normal and breath sounds normal. No respiratory distress. He has no wheezes. He has no rales. He exhibits no tenderness.  Abdominal: Soft. Bowel sounds are normal. He exhibits no distension and no mass. There is no tenderness. There is no rebound and no guarding.  Genitourinary: Testes/scrotum normal and penis normal. No discharge found.  Lymphadenopathy:    He has no cervical adenopathy.  Neurological: He is alert and oriented to person, place, and time.  Skin: Skin is warm and dry. No rash noted.  Psychiatric: Affect normal.  Vitals reviewed.  Recent Results (from the past 2160 hour(s))  Nitric oxide     Status: Abnormal   Collection Time: 11/14/16  2:09 PM  Result Value Ref Range   Nitric Oxide 34    Assessment/Plan: Visit for preventive health examination Depression screen negative. Health Maintenance reviewed -- up-to-date. Preventive schedule discussed and handout given in AVS. Will obtain fasting labs today.   Prostate cancer screening The natural history of prostate cancer and ongoing controversy regarding screening and potential treatment outcomes of prostate cancer  has been discussed with the patient. The meaning of a false positive PSA and a false negative PSA has been discussed. He indicates understanding of the limitations of this screening test and wishes  to proceed with screening PSA testing.   SOBOE (shortness of breath on exertion) In patient with history of asthma and Grade I diastolic function noted on echo. Vitals stable today. Patient euvolemic on exam. Will get repeat Echo. Follow-up with Pulmonology as scheduled.     Piedad Climes, PA-C

## 2016-12-19 NOTE — Progress Notes (Signed)
Pre visit review using our clinic review tool, if applicable. No additional management support is needed unless otherwise documented below in the visit note. 

## 2016-12-19 NOTE — Assessment & Plan Note (Signed)
Depression screen negative. Health Maintenance reviewed -- up-to-date. Preventive schedule discussed and handout given in AVS. Will obtain fasting labs today.  

## 2016-12-19 NOTE — Assessment & Plan Note (Signed)
The natural history of prostate cancer and ongoing controversy regarding screening and potential treatment outcomes of prostate cancer has been discussed with the patient. The meaning of a false positive PSA and a false negative PSA has been discussed. He indicates understanding of the limitations of this screening test and wishes  to proceed with screening PSA testing.  

## 2016-12-23 ENCOUNTER — Encounter: Payer: Self-pay | Admitting: Emergency Medicine

## 2016-12-24 ENCOUNTER — Other Ambulatory Visit: Payer: Self-pay | Admitting: Physician Assistant

## 2016-12-24 DIAGNOSIS — E785 Hyperlipidemia, unspecified: Secondary | ICD-10-CM

## 2016-12-24 DIAGNOSIS — R7989 Other specified abnormal findings of blood chemistry: Secondary | ICD-10-CM

## 2016-12-24 MED ORDER — PITAVASTATIN CALCIUM 2 MG PO TABS: 1.0000 | ORAL_TABLET | Freq: Every day | ORAL | 2 refills | Status: AC

## 2016-12-26 ENCOUNTER — Encounter: Payer: BLUE CROSS/BLUE SHIELD | Admitting: Physician Assistant

## 2017-01-07 ENCOUNTER — Telehealth (HOSPITAL_COMMUNITY): Payer: Self-pay | Admitting: Physician Assistant

## 2017-01-07 NOTE — Telephone Encounter (Signed)
12/19/2016 called mailbox is full evd 12/22/16 mailbox is full evd 12/22/16 [facility] 12/25/16 mailbox is full. evd [facility]  Patient will be removed from the workqueue.

## 2017-05-30 ENCOUNTER — Telehealth: Payer: Self-pay | Admitting: Family Medicine

## 2017-05-30 NOTE — Telephone Encounter (Signed)
I was called by the [facility] to let us know that Mr. Steve Schwartz has passed away.  He arrested at home, and when EMS got to his house, he was in asystole. Resuscitation attempts were made unsuccessfully, and he was unresponsive when he arrived in the ER.  I told them that typically the PCP in our group will complete the death certificate, and I gave him the name [facility] Steve [facility].

## 2017-06-01 NOTE — Telephone Encounter (Signed)
Thank you for making me aware. I will keep an eye out for the certificate. Such a wonderful guy. I am very sorry for his family's loss.

## 2017-06-26 DEATH — deceased
